# Patient Record
Sex: Female | Born: 1950
Health system: Southern US, Community
[De-identification: ages and names within clinical notes are randomized; demographics above are authoritative.]

## PROBLEM LIST (undated history)

## (undated) DIAGNOSIS — N979 Female infertility, unspecified: Secondary | ICD-10-CM

## (undated) DIAGNOSIS — E782 Mixed hyperlipidemia: Secondary | ICD-10-CM

## (undated) DIAGNOSIS — G43909 Migraine, unspecified, not intractable, without status migrainosus: Secondary | ICD-10-CM

## (undated) DIAGNOSIS — N951 Menopausal and female climacteric states: Secondary | ICD-10-CM

## (undated) DIAGNOSIS — R200 Anesthesia of skin: Secondary | ICD-10-CM

## (undated) DIAGNOSIS — I10 Essential (primary) hypertension: Secondary | ICD-10-CM

## (undated) HISTORY — DX: Mixed hyperlipidemia: E78.2

## (undated) HISTORY — PX: APPENDECTOMY: SHX54

## (undated) HISTORY — DX: Migraine, unspecified, not intractable, without status migrainosus: G43.909

## (undated) HISTORY — PX: BREAST BIOPSY: SHX20

## (undated) HISTORY — DX: Menopausal and female climacteric states: N95.1

## (undated) HISTORY — DX: Anesthesia of skin: R20.0

## (undated) HISTORY — DX: Essential (primary) hypertension: I10

## (undated) HISTORY — DX: Female infertility, unspecified: N97.9

## (undated) HISTORY — PX: TONSILLECTOMY AND ADENOIDECTOMY: SHX28

---

## 1996-11-18 HISTORY — PX: ABDOMINAL HYSTERECTOMY: SHX81

## 2010-08-31 ENCOUNTER — Encounter: Admission: RE | Admit: 2010-08-31 | Discharge: 2010-08-31 | Payer: Self-pay | Admitting: Obstetrics and Gynecology

## 2010-10-31 ENCOUNTER — Encounter
Admission: RE | Admit: 2010-10-31 | Discharge: 2010-10-31 | Payer: Self-pay | Source: Home / Self Care | Attending: Obstetrics and Gynecology | Admitting: Obstetrics and Gynecology

## 2011-09-02 ENCOUNTER — Other Ambulatory Visit: Payer: Self-pay | Admitting: Certified Nurse Midwife

## 2011-09-02 DIAGNOSIS — N6320 Unspecified lump in the left breast, unspecified quadrant: Secondary | ICD-10-CM

## 2011-09-04 ENCOUNTER — Ambulatory Visit
Admission: RE | Admit: 2011-09-04 | Discharge: 2011-09-04 | Disposition: A | Discharge status: Home / Self Care | Payer: BC Managed Care – PPO | Source: Ambulatory Visit | Attending: Certified Nurse Midwife | Admitting: Certified Nurse Midwife

## 2011-09-04 DIAGNOSIS — N6320 Unspecified lump in the left breast, unspecified quadrant: Secondary | ICD-10-CM

## 2011-10-21 ENCOUNTER — Other Ambulatory Visit: Payer: Self-pay | Admitting: Obstetrics and Gynecology

## 2011-10-21 DIAGNOSIS — Z1231 Encounter for screening mammogram for malignant neoplasm of breast: Secondary | ICD-10-CM

## 2011-11-13 ENCOUNTER — Ambulatory Visit
Admission: RE | Admit: 2011-11-13 | Discharge: 2011-11-13 | Disposition: A | Discharge status: Home / Self Care | Payer: BC Managed Care – PPO | Source: Ambulatory Visit | Attending: Obstetrics and Gynecology | Admitting: Obstetrics and Gynecology

## 2011-11-13 DIAGNOSIS — Z1231 Encounter for screening mammogram for malignant neoplasm of breast: Secondary | ICD-10-CM

## 2012-10-12 ENCOUNTER — Other Ambulatory Visit: Payer: Self-pay | Admitting: Obstetrics and Gynecology

## 2012-10-12 DIAGNOSIS — Z1231 Encounter for screening mammogram for malignant neoplasm of breast: Secondary | ICD-10-CM

## 2012-11-25 ENCOUNTER — Ambulatory Visit
Admission: RE | Admit: 2012-11-25 | Discharge: 2012-11-25 | Disposition: A | Discharge status: Home / Self Care | Payer: BC Managed Care – PPO | Source: Ambulatory Visit | Attending: Obstetrics and Gynecology | Admitting: Obstetrics and Gynecology

## 2012-11-25 DIAGNOSIS — Z1231 Encounter for screening mammogram for malignant neoplasm of breast: Secondary | ICD-10-CM

## 2012-11-25 LAB — HM MAMMOGRAPHY

## 2013-09-08 ENCOUNTER — Ambulatory Visit: Payer: Self-pay | Admitting: Obstetrics and Gynecology

## 2013-09-15 ENCOUNTER — Ambulatory Visit: Payer: Self-pay | Admitting: Obstetrics and Gynecology

## 2013-09-16 ENCOUNTER — Encounter: Payer: Self-pay | Admitting: Obstetrics and Gynecology

## 2013-09-16 ENCOUNTER — Ambulatory Visit (INDEPENDENT_AMBULATORY_CARE_PROVIDER_SITE_OTHER): Payer: BC Managed Care – PPO | Admitting: Obstetrics and Gynecology

## 2013-09-16 VITALS — BP 110/66 | HR 72 | Resp 16 | Ht 67.25 in | Wt 152.0 lb

## 2013-09-16 DIAGNOSIS — Z01419 Encounter for gynecological examination (general) (routine) without abnormal findings: Secondary | ICD-10-CM

## 2013-09-16 MED ORDER — ESTRADIOL 0.5 MG PO TABS: 0.5000 mg | ORAL_TABLET | Freq: Every day | ORAL | Status: DC

## 2013-09-16 NOTE — Progress Notes (Signed)
GYNECOLOGY VISIT  PCP: Dr. Maurice Small  Referring provider:   HPI: 62 y.o.   Married  Caucasian  female   G0P0 with Patient's last menstrual period was 11/18/1996.   here for   Annual Exam. Estrace 0.5 mg every other day.   No hot flashes. Wants to continue on estrogen.   Poor sleep quality.  Does not stay asleep well. Functions well during the day.   Hgb: PCP  Urine:  PCP  GYNECOLOGIC HISTORY: Patient's last menstrual period was 11/18/1996. Sexually active:  no Partner preference: female Contraception:   TAH/BSO/appy 1998 Menopausal hormone therapy: yes Estradiol DES exposure:   no Blood transfusions:   no Sexually transmitted diseases:   no GYN Procedures:  TAH, Laparoscopy Mammogram:    11/25/12 neg             Pap:   2009 neg History of abnormal pap smear:  no   OB History   Grav Para Term Preterm Abortions TAB SAB Ect Mult Living   0                LIFESTYLE: Exercise:  Walking 5-7 days a week          Tobacco: no Alcohol: occ glass of wine Drug use:  no  OTHER HEALTH MAINTENANCE: Tetanus/TDap: 2012 Gardisil: no Influenza: no  Zostavax: no  Bone density: several years ago Colonoscopy: declined  Cholesterol check: 2013 normal  Family History  Problem Relation Age of Onset  . Lupus Mother   . Osteoporosis Mother   . Diabetes Father   . Hypertension Father   . Breast cancer Sister 14  . Hypertension Brother     There are no active problems to display for this patient.  Past Medical History  Diagnosis Date  . Hypertension   . Migraines   . Infertility, female     Past Surgical History  Procedure Laterality Date  . Appendectomy    . Tonsillectomy and adenoidectomy    . Abdominal hysterectomy  1998    TAH/BSO--d/t fibroids    ALLERGIES: Review of patient's allergies indicates no known allergies.  Current Outpatient Prescriptions  Medication Sig Dispense Refill  . Calcium Carbonate-Vit D-Min (CALCIUM 1200 PO) Take by mouth daily.       Marland Kitchen estradiol (ESTRACE) 0.5 MG tablet 0.5 mg daily.       Marland Kitchen lisinopril (PRINIVIL,ZESTRIL) 10 MG tablet Take 10 mg by mouth daily.      . fish oil-omega-3 fatty acids 1000 MG capsule Take 2 g by mouth daily.      . niacin 500 MG tablet Take 500 mg by mouth daily with breakfast.       No current facility-administered medications for this visit.     ROS:  Pertinent items are noted in HPI.  SOCIAL HISTORY:   Teacher, English as a foreign language, ballroom dancing.   PHYSICAL EXAMINATION:    BP 110/66  Pulse 72  Resp 16  Ht 5' 7.25" (1.708 m)  Wt 152 lb (68.947 kg)  BMI 23.63 kg/m2  LMP 11/18/1996   Wt Readings from Last 3 Encounters:  09/16/13 152 lb (68.947 kg)     Ht Readings from Last 3 Encounters:  09/16/13 5' 7.25" (1.708 m)    General appearance: alert, cooperative and appears stated age Head: Normocephalic, without obvious abnormality, atraumatic Neck: no adenopathy, supple, symmetrical, trachea midline and thyroid not enlarged, symmetric, no tenderness/mass/nodules Lungs: clear to auscultation bilaterally Breasts: Inspection negative, No nipple retraction or dimpling, No nipple discharge or bleeding,  No axillary or supraclavicular adenopathy, Normal to palpation without dominant masses Heart: regular rate and rhythm Abdomen: soft, non-tender; no masses,  no organomegaly Extremities: extremities normal, atraumatic, no cyanosis or edema Skin: Skin color, texture, turgor normal. No rashes or lesions Lymph nodes: Cervical, supraclavicular, and axillary nodes normal. No abnormal inguinal nodes palpated Neurologic: Grossly normal  Pelvic: External genitalia:  no lesions              Urethra:  normal appearing urethra with no masses, tenderness or lesions              Bartholins and Skenes: normal                 Vagina: normal appearing vagina with normal color and discharge, no lesions              Cervix: absent.              Pap and high risk HPV testing done: no.            Bimanual Exam:  Uterus:    absent                                      Adnexa: normal adnexa in size, nontender and no masses                                      Rectovaginal: Confirms                                      Anus:  normal sphincter tone, no lesions  ASSESSMENT  Normal gynecologic exam. Estrogen therapy patient.  Desires to continue.   PLAN  Mammogram Pap smear and high risk HPV testing not indicated Estrace 0.5 mg daily.  OK if patient chooses to take every other day. #90.  RF - 3.  I discussed risks and benefits with patient, and she wishes to continue.  Medications per Epic orders. Stool hemoccult kit to patient. Return annually or prn   An After Visit Summary was printed and given to the patient.

## 2013-09-16 NOTE — Patient Instructions (Signed)

## 2013-10-20 ENCOUNTER — Other Ambulatory Visit: Payer: Self-pay

## 2013-10-20 ENCOUNTER — Telehealth: Payer: Self-pay | Admitting: *Deleted

## 2013-10-20 DIAGNOSIS — Z1231 Encounter for screening mammogram for malignant neoplasm of breast: Secondary | ICD-10-CM

## 2013-10-20 NOTE — Telephone Encounter (Signed)
Message copied by Lorraine Lax on Wed Oct 20, 2013 10:52 AM ------      Message from: Conley Simmonds      Created: Wed Oct 20, 2013 10:37 AM      Regarding: Please contact patient to remind her of doing IFOB.       Please contact patient as above.            Thanks.             ----- Message -----         From: SYSTEM         Sent: 09/21/2013  12:09 AM           To: Melony Overly, MD                   ------

## 2013-10-20 NOTE — Telephone Encounter (Signed)
Returning call.

## 2013-10-20 NOTE — Telephone Encounter (Signed)
Patient notified and reminded to send her IFOB in as soon as she can. Asked about her Estardiol rx which was sent 09/16/13 #90/3 refills says she hasn't received anything. Called [facility] patient was sent a shipment on 08/17/13 her new rx doesn't go into affect until 09/07/13. Notified patient of this she said she does have plenty of Estradiol just wanted to see what was going on.

## 2013-10-20 NOTE — Telephone Encounter (Signed)
Left Message To Call Back  

## 2013-10-20 NOTE — Telephone Encounter (Signed)
Typo  Shipment sent in 11/07/13**

## 2013-10-22 NOTE — Progress Notes (Signed)
Patient notified of importance of doing IFOB 10/20/13

## 2013-11-26 ENCOUNTER — Ambulatory Visit
Admission: RE | Admit: 2013-11-26 | Discharge: 2013-11-26 | Disposition: A | Discharge status: Home / Self Care | Payer: BC Managed Care – PPO | Source: Ambulatory Visit

## 2013-11-26 DIAGNOSIS — Z1231 Encounter for screening mammogram for malignant neoplasm of breast: Secondary | ICD-10-CM

## 2013-12-03 ENCOUNTER — Encounter: Payer: Self-pay | Admitting: Emergency Medicine

## 2014-06-18 ENCOUNTER — Encounter: Payer: Self-pay | Admitting: *Deleted

## 2014-07-22 ENCOUNTER — Encounter: Payer: Self-pay | Admitting: Obstetrics and Gynecology

## 2014-09-21 ENCOUNTER — Ambulatory Visit: Payer: BC Managed Care – PPO | Admitting: Obstetrics and Gynecology

## 2014-09-22 ENCOUNTER — Ambulatory Visit: Payer: BC Managed Care – PPO | Admitting: Obstetrics and Gynecology

## 2014-10-24 ENCOUNTER — Other Ambulatory Visit: Payer: Self-pay

## 2014-10-24 DIAGNOSIS — Z1231 Encounter for screening mammogram for malignant neoplasm of breast: Secondary | ICD-10-CM

## 2014-11-04 ENCOUNTER — Other Ambulatory Visit: Payer: Self-pay | Admitting: Obstetrics and Gynecology

## 2014-11-07 NOTE — Telephone Encounter (Signed)
Medication refill request: Estrace 0.5mg  Last AEX:  09/16/13 Next AEX: 02/01/15 Last MMG (if hormonal medication request): 11/26/13 Bi-Rads Neg Refill authorized: #90 X 2

## 2014-11-30 ENCOUNTER — Ambulatory Visit
Admission: RE | Admit: 2014-11-30 | Discharge: 2014-11-30 | Disposition: A | Discharge status: Home / Self Care | Payer: BLUE CROSS/BLUE SHIELD | Source: Ambulatory Visit

## 2014-11-30 DIAGNOSIS — Z1231 Encounter for screening mammogram for malignant neoplasm of breast: Secondary | ICD-10-CM

## 2015-02-01 ENCOUNTER — Ambulatory Visit: Payer: BLUE CROSS/BLUE SHIELD | Admitting: Cardiovascular Disease

## 2015-02-01 ENCOUNTER — Ambulatory Visit: Payer: BC Managed Care – PPO | Admitting: Obstetrics and Gynecology

## 2015-10-02 ENCOUNTER — Other Ambulatory Visit: Payer: Self-pay | Admitting: Obstetrics and Gynecology

## 2015-11-09 ENCOUNTER — Other Ambulatory Visit: Payer: Self-pay

## 2015-11-09 DIAGNOSIS — Z1231 Encounter for screening mammogram for malignant neoplasm of breast: Secondary | ICD-10-CM

## 2015-12-07 ENCOUNTER — Ambulatory Visit
Admission: RE | Admit: 2015-12-07 | Discharge: 2015-12-07 | Disposition: A | Discharge status: Home / Self Care | Payer: BLUE CROSS/BLUE SHIELD | Source: Ambulatory Visit

## 2015-12-07 DIAGNOSIS — Z1231 Encounter for screening mammogram for malignant neoplasm of breast: Secondary | ICD-10-CM

## 2016-03-28 DIAGNOSIS — H43811 Vitreous degeneration, right eye: Secondary | ICD-10-CM | POA: Diagnosis not present

## 2016-06-03 DIAGNOSIS — M170 Bilateral primary osteoarthritis of knee: Secondary | ICD-10-CM | POA: Diagnosis not present

## 2016-11-01 ENCOUNTER — Other Ambulatory Visit: Payer: Self-pay | Admitting: Family Medicine

## 2016-11-01 DIAGNOSIS — Z1231 Encounter for screening mammogram for malignant neoplasm of breast: Secondary | ICD-10-CM

## 2016-12-09 ENCOUNTER — Ambulatory Visit
Admission: RE | Admit: 2016-12-09 | Discharge: 2016-12-09 | Disposition: A | Discharge status: Home / Self Care | Payer: BLUE CROSS/BLUE SHIELD | Source: Ambulatory Visit | Attending: Family Medicine | Admitting: Family Medicine

## 2016-12-09 DIAGNOSIS — Z1231 Encounter for screening mammogram for malignant neoplasm of breast: Secondary | ICD-10-CM

## 2016-12-17 DIAGNOSIS — M79602 Pain in left arm: Secondary | ICD-10-CM | POA: Diagnosis not present

## 2016-12-17 DIAGNOSIS — Z23 Encounter for immunization: Secondary | ICD-10-CM | POA: Diagnosis not present

## 2017-01-24 ENCOUNTER — Emergency Department (HOSPITAL_COMMUNITY)
Admission: EM | Admit: 2017-01-24 | Discharge: 2017-01-24 | Disposition: A | Discharge status: Home / Self Care | Payer: BLUE CROSS/BLUE SHIELD | Attending: Emergency Medicine | Admitting: Emergency Medicine

## 2017-01-24 ENCOUNTER — Emergency Department (HOSPITAL_COMMUNITY): Payer: BLUE CROSS/BLUE SHIELD

## 2017-01-24 ENCOUNTER — Encounter (HOSPITAL_COMMUNITY): Payer: Self-pay | Admitting: *Deleted

## 2017-01-24 DIAGNOSIS — R0989 Other specified symptoms and signs involving the circulatory and respiratory systems: Secondary | ICD-10-CM | POA: Diagnosis not present

## 2017-01-24 DIAGNOSIS — S1095XA Superficial foreign body of unspecified part of neck, initial encounter: Secondary | ICD-10-CM | POA: Diagnosis not present

## 2017-01-24 DIAGNOSIS — I10 Essential (primary) hypertension: Secondary | ICD-10-CM | POA: Diagnosis not present

## 2017-01-24 NOTE — ED Provider Notes (Signed)
[facility]-EMERGENCY DEPT Provider Note   CSN: [id number] Arrival date & time: 01/24/17  1856     History   Chief Complaint Chief Complaint  Patient presents with  . Swallowed Foreign Body    HPI Janice IdeDeborah Faulkner is a 66 y.o. female presents with foreign body sensation in the back of her throat described as intermittent, sharp pain after eating fish prior to arrival. Patient states that she finished her meal without any issues, few minutes later she felt a hot sensation in the back of her throat. She tried brushing her teeth, coughing and flossing her teeth but the sensation persisted. She was concerned she may have a fishbone stuck in the back of her throat and came to the ED for evaluation. Patient denies difficulty breathing, difficulty swallowing, difficulty controlling oral secretions. Patient has been tolerating water by mouth without issues. No changes in her voice. No cough.  HPI  Past Medical History:  Diagnosis Date  . Hypertension   . Infertility, female   . Menopausal symptoms   . Migraines   . Mixed hyperlipidemia     There are no active problems to display for this patient.   Past Surgical History:  Procedure Laterality Date  . ABDOMINAL HYSTERECTOMY  1998   TAH/BSO--d/t fibroids  . APPENDECTOMY    . TONSILLECTOMY AND ADENOIDECTOMY      OB History    Gravida Para Term Preterm AB Living   0             SAB TAB Ectopic Multiple Live Births                   Home Medications    Prior to Admission medications   Medication Sig Start Date End Date Taking? Authorizing Provider  Calcium Carbonate-Vit D-Min (CALCIUM 1200 PO) Take 1 tablet by mouth daily.    Yes Historical Provider, MD  lisinopril (PRINIVIL,ZESTRIL) 10 MG tablet Take 10 mg by mouth daily.   Yes Historical Provider, MD  estradiol (ESTRACE) 0.5 MG tablet TAKE 1 TABLET DAILY Patient not taking: Reported on 01/24/2017 11/07/14   Patton SallesBrook E Amundson C Silva, MD    Family History Family History  Problem  Relation Age of Onset  . Lupus Mother   . Osteoporosis Mother   . Diabetes Father   . Hypertension Father   . Breast cancer Sister [id number]  . Hypertension Brother     Social History Social History  Substance Use Topics  . Smoking status: Never Smoker  . Smokeless tobacco: Never Used  . Alcohol use No     Allergies   Patient has no known allergies.   Review of Systems Review of Systems  HENT: Negative for drooling, sore throat, trouble swallowing and voice change.   Respiratory: Negative for cough, choking, chest tightness and shortness of breath.   Cardiovascular: Negative for chest pain.  Gastrointestinal: Negative for nausea and vomiting.  Musculoskeletal: Negative for neck pain.  Neurological: Negative for speech difficulty.  Hematological: Does not bruise/bleed easily.  Psychiatric/Behavioral: Negative.      Physical Exam Updated Vital Signs BP (!) 125/54   Pulse 72   Temp 97.6 F (36.4 C) (Oral)   Resp 16   Ht 5' 7.5" (1.715 m)   Wt 66 kg   LMP 11/18/1996   SpO2 98%   BMI 22.45 kg/m   Physical Exam  Constitutional: She is oriented to person, place, and time. She appears well-developed and well-nourished.  HENT:  Head: Normocephalic and atraumatic.  Nose: Nose normal.  Mouth/Throat: Oropharynx is clear and moist. No oropharyngeal exudate.  Moist mucous membranes. Uvula is midline. No tonsillar edema, erythema or inflammation. No pooling of oral secretions. Oral airway is widely patent. No foreign bodies visualized in posterior oropharynx.  Eyes: Conjunctivae and EOM are normal. Pupils are equal, round, and reactive to light.  Neck: Normal range of motion. Neck supple. No JVD present.  No anterior neck tenderness. No cervical adenopathy.  Cardiovascular: Normal rate, regular rhythm, normal heart sounds and intact distal pulses.   No murmur heard. Pulmonary/Chest: Effort normal and breath sounds normal. No respiratory distress. She has no wheezes. She has no  rales. She exhibits no tenderness.  Abdominal: Soft. There is no tenderness.  Musculoskeletal: Normal range of motion. She exhibits no deformity.  Lymphadenopathy:    She has no cervical adenopathy.  Neurological: She is alert and oriented to person, place, and time. No sensory deficit.  Skin: Skin is warm and dry. Capillary refill takes less than 2 seconds.  Psychiatric: She has a normal mood and affect. Her behavior is normal. Judgment and thought content normal.  Nursing note and vitals reviewed.    ED Treatments / Results  Labs (all labs ordered are listed, but only abnormal results are displayed) Labs Reviewed - No data to display  EKG  EKG Interpretation None       Radiology Ct Soft Tissue Neck Wo Contrast  Result Date: 01/24/2017 CLINICAL DATA:  Fish bone lodged in back of throat today. Assess for foreign body. EXAM: CT NECK WITHOUT CONTRAST TECHNIQUE: Multidetector CT imaging of the neck was performed following the standard protocol without intravenous contrast. COMPARISON:  None. FINDINGS: PHARYNX AND LARYNX: 6 mm focus of subcutaneous gas at nasopharynx. Normal. Widely patent airway. No radiopaque foreign bodies. SALIVARY GLANDS: Normal. THYROID: Normal. LYMPH NODES: No lymphadenopathy by CT size criteria though sensitivity decreased without contrast. VASCULAR: Trace calcific atherosclerosis RIGHT carotid bifurcation. LIMITED INTRACRANIAL: Normal. VISUALIZED ORBITS: Normal. MASTOIDS AND VISUALIZED PARANASAL SINUSES: LEFT anterior frontal osteoma. Small LEFT maxillary mucosal retention cyst. Mastoid air cells are well aerated. SKELETON: Nonacute. Severe facet arthropathy. RIGHT maxillary molar periapical lucency/abscess. UPPER CHEST: Lung apices are clear. No superior mediastinal lymphadenopathy. OTHER: None. IMPRESSION: No radiopaque foreign bodies. 6 mm focus of subcutaneous gas nasopharynx suggests penetrating trauma. Electronically Signed   By: Awilda Metro M.D.   On:  01/24/2017 19:59    Procedures Procedures (including critical care time)  Medications Ordered in ED Medications - No data to display   Initial Impression / Assessment and Plan / ED Course  I have reviewed the triage vital signs and the nursing notes.  Pertinent labs & imaging results that were available during my care of the patient were reviewed by me and considered in my medical decision making (see chart for details).  Clinical Course as of Jan 24 2253  Fri Jan 24, 2017  2159 IMPRESSION: No radiopaque foreign bodies. 6 mm focus of subcutaneous gas nasopharynx suggests penetrating trauma. CT Soft Tissue Neck Wo Contrast [CG]    Clinical Course User Index [CG] Liberty Handy, PA-C   66 year old female with no pertinent past medical history presents to ED with foreign body sensation in the back of her throat after eating fish. Patient reporting intermittent, sharp left-sided throat pain. Exam is reassuring. Patient is not having compromise of oral airway, no voice changes, no anterior neck tenderness. CT scan shows small area of subcutaneous gas nasopharynx suggesting penetrating trauma however no radiopaque foreign  bodies were visualized. Given reassuring exam patient will be discharged at this time with strict ED return precautions, ENT follow-up. Patient tolerating fluids by mouth without complications. No further emergent lab work or imaging indicated at this time. Patient discussed with Dr. Fredderick Phenix who agrees with ED workup and discharge plan. Discussed plan with patient and her husband who are agreeable to discharge. Patient aware of red flag symptoms that would warrant return to the emergency department for further workup.  Final Clinical Impressions(s) / ED Diagnoses   Final diagnoses:  Foreign body sensation in throat    New Prescriptions New Prescriptions   No medications on file     Liberty Handy, PA-C 01/24/17 2254    Rolan Bucco, MD 01/24/17 2359

## 2017-01-24 NOTE — ED Triage Notes (Signed)
The pt was eating perch aprox one hour ago small bone lodged in her lt throat she has not been able to remove it

## 2017-01-24 NOTE — ED Notes (Signed)
Pt states she was eating fish and believes she swallowed some bones from the fish.

## 2017-01-24 NOTE — Discharge Instructions (Signed)
CT scan findings: IMPRESSION:  No radiopaque foreign bodies. 6 mm focus of subcutaneous gas  nasopharynx suggests penetrating trauma.   FINDINGS:  PHARYNX AND LARYNX: 6 mm focus of subcutaneous gas at nasopharynx.  Normal. Widely patent airway. No radiopaque foreign bodies.   As we discussed there is a small area of subcutaneous gast at your nasopharynx suggesting recent penetrating trauma somewhere along your upper GI tract.  At this time there is no further emergent intervention recommended as you are stable, are not having difficulty breathing, have no voice changes or have any other concerning symptoms.  Please monitor your symptoms. Return to the emergency department if your symptoms worsen, you develop difficulty swallowing your oral secretions, changes in your voice, difficulty breathing or if your symptoms become concerning in any way.   We recommend a soft diet for the next couple of days to prevent further irritation.  Please call ENT to make an appointment for follow up, as needed based on your symptoms.

## 2017-02-26 ENCOUNTER — Other Ambulatory Visit: Payer: Self-pay | Admitting: Family Medicine

## 2017-02-26 ENCOUNTER — Ambulatory Visit
Admission: RE | Admit: 2017-02-26 | Discharge: 2017-02-26 | Disposition: A | Discharge status: Home / Self Care | Payer: BLUE CROSS/BLUE SHIELD | Source: Ambulatory Visit | Attending: Family Medicine | Admitting: Family Medicine

## 2017-02-26 DIAGNOSIS — M7989 Other specified soft tissue disorders: Secondary | ICD-10-CM | POA: Diagnosis not present

## 2017-02-26 DIAGNOSIS — R52 Pain, unspecified: Secondary | ICD-10-CM

## 2017-02-26 DIAGNOSIS — M25539 Pain in unspecified wrist: Secondary | ICD-10-CM | POA: Diagnosis not present

## 2017-02-26 DIAGNOSIS — R609 Edema, unspecified: Secondary | ICD-10-CM

## 2017-03-10 DIAGNOSIS — M25512 Pain in left shoulder: Secondary | ICD-10-CM | POA: Diagnosis not present

## 2017-03-10 DIAGNOSIS — M25532 Pain in left wrist: Secondary | ICD-10-CM | POA: Diagnosis not present

## 2017-03-11 DIAGNOSIS — M542 Cervicalgia: Secondary | ICD-10-CM | POA: Diagnosis not present

## 2017-03-11 DIAGNOSIS — G5602 Carpal tunnel syndrome, left upper limb: Secondary | ICD-10-CM | POA: Diagnosis not present

## 2017-03-11 DIAGNOSIS — M25511 Pain in right shoulder: Secondary | ICD-10-CM | POA: Diagnosis not present

## 2017-03-11 DIAGNOSIS — M25512 Pain in left shoulder: Secondary | ICD-10-CM | POA: Diagnosis not present

## 2017-03-18 DIAGNOSIS — M542 Cervicalgia: Secondary | ICD-10-CM | POA: Diagnosis not present

## 2017-03-18 DIAGNOSIS — M25512 Pain in left shoulder: Secondary | ICD-10-CM | POA: Diagnosis not present

## 2017-03-18 DIAGNOSIS — M25532 Pain in left wrist: Secondary | ICD-10-CM | POA: Diagnosis not present

## 2017-03-25 DIAGNOSIS — M5412 Radiculopathy, cervical region: Secondary | ICD-10-CM | POA: Diagnosis not present

## 2017-04-02 DIAGNOSIS — M542 Cervicalgia: Secondary | ICD-10-CM | POA: Diagnosis not present

## 2017-04-02 DIAGNOSIS — M5412 Radiculopathy, cervical region: Secondary | ICD-10-CM | POA: Diagnosis not present

## 2017-04-10 DIAGNOSIS — M5412 Radiculopathy, cervical region: Secondary | ICD-10-CM | POA: Diagnosis not present

## 2017-04-29 DIAGNOSIS — Z23 Encounter for immunization: Secondary | ICD-10-CM | POA: Diagnosis not present

## 2017-04-29 DIAGNOSIS — E782 Mixed hyperlipidemia: Secondary | ICD-10-CM | POA: Diagnosis not present

## 2017-04-29 DIAGNOSIS — G629 Polyneuropathy, unspecified: Secondary | ICD-10-CM | POA: Diagnosis not present

## 2017-04-29 DIAGNOSIS — I10 Essential (primary) hypertension: Secondary | ICD-10-CM | POA: Diagnosis not present

## 2017-04-29 DIAGNOSIS — Z0000 Encounter for general adult medical examination without abnormal findings: Secondary | ICD-10-CM | POA: Diagnosis not present

## 2017-05-05 ENCOUNTER — Ambulatory Visit (INDEPENDENT_AMBULATORY_CARE_PROVIDER_SITE_OTHER): Payer: BLUE CROSS/BLUE SHIELD | Admitting: Neurology

## 2017-05-05 ENCOUNTER — Telehealth: Payer: Self-pay | Admitting: *Deleted

## 2017-05-05 ENCOUNTER — Encounter: Payer: Self-pay | Admitting: Neurology

## 2017-05-05 VITALS — BP 118/59 | HR 77 | Ht 68.0 in | Wt 148.5 lb

## 2017-05-05 DIAGNOSIS — M25561 Pain in right knee: Principal | ICD-10-CM

## 2017-05-05 DIAGNOSIS — G8929 Other chronic pain: Secondary | ICD-10-CM

## 2017-05-05 DIAGNOSIS — M25562 Pain in left knee: Principal | ICD-10-CM

## 2017-05-05 DIAGNOSIS — M25532 Pain in left wrist: Secondary | ICD-10-CM | POA: Insufficient documentation

## 2017-05-05 DIAGNOSIS — M25552 Pain in left hip: Principal | ICD-10-CM

## 2017-05-05 DIAGNOSIS — M25551 Pain in right hip: Principal | ICD-10-CM

## 2017-05-05 MED ORDER — PREDNISONE 10 MG PO TABS: 10.0000 mg | ORAL_TABLET | Freq: Two times a day (BID) | ORAL | 3 refills | Status: DC

## 2017-05-05 NOTE — Patient Instructions (Signed)
  We will get the blood work done from your physician, consider further evaluation.

## 2017-05-05 NOTE — Progress Notes (Signed)
Reason for visit: Left wrist pain  Referring physician: Dr. Warnell Bureau Faulkner is a 66 y.o. female  History of present illness:  Ms. Janice Faulkner is a 66 year old right-handed white female with a history of onset of symptoms that began on 02/12/2017. The patient woke up at 3 AM with severe left wrist and hand discomfort with associated warmth and swelling at the wrist. The patient had some tingly sensations into the left hand. She was seen by Dr. Mina Faulkner and underwent EMG and nerve conduction study evaluation that suggested a mild left carpal tunnel syndrome. The patient was treated with nonsteroidal anti-inflammatory medications initially without benefit, but eventually she was placed on prednisone which improved symptoms within hours of taking the medication. The patient also has had some discomfort in the left triceps area and some discomfort into the shoulders bilaterally and the hips bilaterally. She reports no problems with weakness or balance changes. The patient has not had any neck pain or low back pain and no problems controlling the bowels or the bladder. The patient feels well when she is on the prednisone, but when she comes off the medication the symptoms return. She has had blood work done through her primary care physician, results of this are not available to me. The patient has undergone MRI evaluation of the cervical spine that was brought for my review. This appears to show minimal disc bulges at the C3-4 and C6-7 levels, no evidence of nerve root impingement was seen.    Past Medical History:  Diagnosis Date  . Hypertension   . Infertility, female   . Menopausal symptoms   . Migraines   . Mixed hyperlipidemia     Past Surgical History:  Procedure Laterality Date  . ABDOMINAL HYSTERECTOMY  1998   TAH/BSO--d/t fibroids  . APPENDECTOMY    . TONSILLECTOMY AND ADENOIDECTOMY      Family History  Problem Relation Age of Onset  . Lupus Mother   . Osteoporosis Mother     . Diabetes Father   . Hypertension Father   . Breast cancer Sister 43  . Hypertension Brother     Social history:  reports that she has never smoked. She has never used smokeless tobacco. She reports that she does not drink alcohol or use drugs.  Medications:  Prior to Admission medications   Medication Sig Start Date End Date Taking? Authorizing Provider  Calcium Carbonate-Vit D-Min (CALCIUM 1200 PO) Take 1 tablet by mouth daily.    Yes [provider]  lisinopril (PRINIVIL,ZESTRIL) 10 MG tablet Take 10 mg by mouth daily.   Yes [provider]  methylPREDNISolone (MEDROL DOSEPAK) 4 MG TBPK tablet follow package directions 03/18/17  Yes [provider]     No Known Allergies  ROS:  Out of a complete 14 system review of symptoms, the patient complains only of the following symptoms, and all other reviewed systems are negative.  Joint pain, joint swelling, aching muscles numbness  Blood pressure (!) 118/59, pulse 77, height 5\' 8"  (1.727 m), weight 148 lb 8 oz (67.4 kg), last menstrual period 11/18/1996.  Physical Exam  General: The patient is alert and cooperative at the time of the examination.  Eyes: Pupils are equal, round, and reactive to light. Discs are flat bilaterally.  Neck: The neck is supple, no carotid bruits are noted.  Respiratory: The respiratory examination is clear.  Cardiovascular: The cardiovascular examination reveals a regular rate and rhythm, no obvious murmurs or rubs are noted.  Skin:  Extremities are without significant edema.  Neurologic Exam  Mental status: The patient is alert and oriented x 3 at the time of the examination. The patient has apparent normal recent and remote memory, with an apparently normal attention span and concentration ability.  Cranial nerves: Facial symmetry is present. There is good sensation of the face to pinprick and soft touch bilaterally. The strength of the facial muscles and the muscles to  head turning and shoulder shrug are normal bilaterally. Speech is well enunciated, no aphasia or dysarthria is noted. Extraocular movements are full. Visual fields are full. The tongue is midline, and the patient has symmetric elevation of the soft palate. No obvious hearing deficits are noted.  Motor: The motor testing reveals 5 over 5 strength of all 4 extremities. Good symmetric motor tone is noted throughout.  Sensory: Sensory testing is intact to pinprick, soft touch, vibration sensation, and position sense on all 4 extremities. No evidence of extinction is noted.  Coordination: Cerebellar testing reveals good finger-nose-finger and heel-to-shin bilaterally.  Gait and station: Gait is normal. Tandem gait is normal. Romberg is negative. No drift is seen.  Reflexes: Deep tendon reflexes are symmetric and normal bilaterally. Toes are downgoing bilaterally.   Assessment/Plan:   1. Left wrist pain  The patient appears to have developed an inflammatory arthritis or tendinitis issue. The patient has pain and swelling in the left wrist that developed rapidly, the symptoms are quite responsive to prednisone. The patient has had blood work done through the primary care physician, the results are not available to me. I will need to review the blood work, and determine whether or not further blood work as needed. The patient will be given a referral to a rheumatology physician. I will contact the patient within the next day or so.  Marlan Faulkner. Janice Willis MD 05/05/2017 8:42 AM  [facility] [address] [place], [place] [id number]  Phone [phone] Fax [phone]

## 2017-05-05 NOTE — Telephone Encounter (Signed)
Called and spoke with Clydie BraunKaren from PCP Maurice Small([facility]) office. Requested recent blood work results faxed to our office at [phone]. She will fax over to our office as requested.

## 2017-05-05 NOTE — Telephone Encounter (Signed)
Called PCP office back. Advised we have not received lab results. Spoke with Okey Regalarol. Clydie BraunKaren not available to speak with. She will try and re-fax. Verified fax number.

## 2017-05-06 NOTE — Telephone Encounter (Signed)
I called patient. The patient did have blood work that included a comprehensive metabolic profile that was unremarkable, sedimentation rate was 14, but the patient was on prednisone at the time. We will check further blood work looking for rheumatoid arthritis or lupus. The patient has been referred to a rheumatologist.

## 2017-05-06 NOTE — Telephone Encounter (Signed)
Dr Anne HahnWillis- received results via fax. Gave to you for review, thank you

## 2017-05-06 NOTE — Telephone Encounter (Signed)
Called and spoke with Clydie BraunKaren. Advised we are having fax issues and asked her to try and re-fax lab results from 04/29/17 to [phone]. She will re-send.

## 2017-05-06 NOTE — Addendum Note (Signed)
Addended by: York SpanielWILLIS, Tiyanna Larcom K on: 05/06/2017 04:46 PM   Modules accepted: Orders

## 2017-05-08 ENCOUNTER — Other Ambulatory Visit (INDEPENDENT_AMBULATORY_CARE_PROVIDER_SITE_OTHER): Payer: Self-pay

## 2017-05-08 DIAGNOSIS — M25551 Pain in right hip: Secondary | ICD-10-CM | POA: Diagnosis not present

## 2017-05-08 DIAGNOSIS — M25561 Pain in right knee: Secondary | ICD-10-CM | POA: Diagnosis not present

## 2017-05-08 DIAGNOSIS — M25562 Pain in left knee: Principal | ICD-10-CM

## 2017-05-08 DIAGNOSIS — G8929 Other chronic pain: Secondary | ICD-10-CM

## 2017-05-08 DIAGNOSIS — M25552 Pain in left hip: Secondary | ICD-10-CM | POA: Diagnosis not present

## 2017-05-08 DIAGNOSIS — Z0289 Encounter for other administrative examinations: Secondary | ICD-10-CM

## 2017-05-10 LAB — PAN-ANCA
ANCA Proteinase 3: <3.5 U/mL (ref 0.0–3.5)
Atypical pANCA: <1:20 {titer}
C-ANCA: <1:20 {titer}
Myeloperoxidase Ab: <9 U/mL (ref 0.0–9.0)

## 2017-05-10 LAB — B. BURGDORFI ANTIBODIES

## 2017-05-10 LAB — ANA W/REFLEX: ANA: NEGATIVE

## 2017-05-10 LAB — ANGIOTENSIN CONVERTING ENZYME: Angio Convert Enzyme: <15 U/L (ref 14–82)

## 2017-05-10 LAB — CYCLIC CITRUL PEPTIDE ANTIBODY, IGG/IGA: CYCLIC CITRULLIN PEPTIDE AB: 7 U (ref 0–19)

## 2017-05-10 LAB — RHEUMATOID FACTOR: Rhuematoid fact SerPl-aCnc: <10 IU/mL (ref 0.0–13.9)

## 2017-05-12 ENCOUNTER — Telehealth: Payer: Self-pay | Admitting: *Deleted

## 2017-05-12 NOTE — Telephone Encounter (Signed)
I called the patient, I do want her to see the rheumatology position. The etiology of the wrist swelling and pain, significant sensitivity to prednisone suggests an inflammatory problem.

## 2017-05-12 NOTE — Telephone Encounter (Signed)
Called and spoke with patient about unremarkable labs per CW,MD note. Patient verbalized understanding.   She is scheduled to see rheumatologist August 20th. She is wondering if Dr Anne HahnWillis still wants her to see rheumatologist since labs came back unremarkable. If he does, she wants to know if he can try and get her in sooner thatn August 20th?  She is going on vacation June 29th-July 9th.   She also tried to wean off prednisone last night but pain severe. Unable to do this. She went back to taking it.  She is wondering if prednisone use affected her lab results.   Her wrist is still swollen.  Advised I will route questions to CW,MD. We will call back to advise.

## 2017-05-12 NOTE — Telephone Encounter (Signed)
-----   Message from York Spanielharles K Willis, MD sent at 05/11/2017  2:56 PM EDT -----  The blood work results are unremarkable. Please call the patient.  ----- Message ----- From: Nell RangeInterface, [facility] Lab Results In Sent: 05/09/2017   7:43 AM To: York Spanielharles K Willis, MD

## 2017-06-26 DIAGNOSIS — R5382 Chronic fatigue, unspecified: Secondary | ICD-10-CM | POA: Diagnosis not present

## 2017-06-26 DIAGNOSIS — M2550 Pain in unspecified joint: Secondary | ICD-10-CM | POA: Diagnosis not present

## 2017-07-11 DIAGNOSIS — R5382 Chronic fatigue, unspecified: Secondary | ICD-10-CM | POA: Diagnosis not present

## 2017-07-11 DIAGNOSIS — M0579 Rheumatoid arthritis with rheumatoid factor of multiple sites without organ or systems involvement: Secondary | ICD-10-CM | POA: Diagnosis not present

## 2017-07-11 DIAGNOSIS — M2550 Pain in unspecified joint: Secondary | ICD-10-CM | POA: Diagnosis not present

## 2017-09-02 DIAGNOSIS — R5382 Chronic fatigue, unspecified: Secondary | ICD-10-CM | POA: Diagnosis not present

## 2017-09-02 DIAGNOSIS — M0579 Rheumatoid arthritis with rheumatoid factor of multiple sites without organ or systems involvement: Secondary | ICD-10-CM | POA: Diagnosis not present

## 2017-09-02 DIAGNOSIS — M2550 Pain in unspecified joint: Secondary | ICD-10-CM | POA: Diagnosis not present

## 2017-09-30 DIAGNOSIS — M0609 Rheumatoid arthritis without rheumatoid factor, multiple sites: Secondary | ICD-10-CM | POA: Diagnosis not present

## 2017-09-30 DIAGNOSIS — R5382 Chronic fatigue, unspecified: Secondary | ICD-10-CM | POA: Diagnosis not present

## 2017-09-30 DIAGNOSIS — Z23 Encounter for immunization: Secondary | ICD-10-CM | POA: Diagnosis not present

## 2017-09-30 DIAGNOSIS — M2550 Pain in unspecified joint: Secondary | ICD-10-CM | POA: Diagnosis not present

## 2017-10-22 DIAGNOSIS — M0579 Rheumatoid arthritis with rheumatoid factor of multiple sites without organ or systems involvement: Secondary | ICD-10-CM | POA: Diagnosis not present

## 2017-11-04 ENCOUNTER — Other Ambulatory Visit: Payer: Self-pay | Admitting: Family Medicine

## 2017-11-04 DIAGNOSIS — Z139 Encounter for screening, unspecified: Secondary | ICD-10-CM

## 2017-11-24 DIAGNOSIS — M0579 Rheumatoid arthritis with rheumatoid factor of multiple sites without organ or systems involvement: Secondary | ICD-10-CM | POA: Diagnosis not present

## 2017-11-25 DIAGNOSIS — L570 Actinic keratosis: Secondary | ICD-10-CM | POA: Diagnosis not present

## 2017-11-25 DIAGNOSIS — L821 Other seborrheic keratosis: Secondary | ICD-10-CM | POA: Diagnosis not present

## 2017-11-25 DIAGNOSIS — D225 Melanocytic nevi of trunk: Secondary | ICD-10-CM | POA: Diagnosis not present

## 2017-12-01 DIAGNOSIS — R5382 Chronic fatigue, unspecified: Secondary | ICD-10-CM | POA: Diagnosis not present

## 2017-12-01 DIAGNOSIS — M2550 Pain in unspecified joint: Secondary | ICD-10-CM | POA: Diagnosis not present

## 2017-12-01 DIAGNOSIS — M0609 Rheumatoid arthritis without rheumatoid factor, multiple sites: Secondary | ICD-10-CM | POA: Diagnosis not present

## 2017-12-02 DIAGNOSIS — D485 Neoplasm of uncertain behavior of skin: Secondary | ICD-10-CM | POA: Diagnosis not present

## 2017-12-02 DIAGNOSIS — C44321 Squamous cell carcinoma of skin of nose: Secondary | ICD-10-CM | POA: Diagnosis not present

## 2017-12-10 ENCOUNTER — Ambulatory Visit
Admission: RE | Admit: 2017-12-10 | Discharge: 2017-12-10 | Disposition: A | Discharge status: Home / Self Care | Payer: BLUE CROSS/BLUE SHIELD | Source: Ambulatory Visit | Attending: Family Medicine | Admitting: Family Medicine

## 2017-12-10 DIAGNOSIS — Z1231 Encounter for screening mammogram for malignant neoplasm of breast: Secondary | ICD-10-CM | POA: Diagnosis not present

## 2017-12-10 DIAGNOSIS — Z139 Encounter for screening, unspecified: Secondary | ICD-10-CM

## 2017-12-29 DIAGNOSIS — H52203 Unspecified astigmatism, bilateral: Secondary | ICD-10-CM | POA: Diagnosis not present

## 2017-12-29 DIAGNOSIS — H2513 Age-related nuclear cataract, bilateral: Secondary | ICD-10-CM | POA: Diagnosis not present

## 2017-12-29 DIAGNOSIS — H524 Presbyopia: Secondary | ICD-10-CM | POA: Diagnosis not present

## 2018-01-07 DIAGNOSIS — C44321 Squamous cell carcinoma of skin of nose: Secondary | ICD-10-CM | POA: Diagnosis not present

## 2018-01-19 DIAGNOSIS — M0579 Rheumatoid arthritis with rheumatoid factor of multiple sites without organ or systems involvement: Secondary | ICD-10-CM | POA: Diagnosis not present

## 2018-02-25 DIAGNOSIS — D485 Neoplasm of uncertain behavior of skin: Secondary | ICD-10-CM | POA: Diagnosis not present

## 2018-02-25 DIAGNOSIS — L570 Actinic keratosis: Secondary | ICD-10-CM | POA: Diagnosis not present

## 2018-03-11 DIAGNOSIS — M0579 Rheumatoid arthritis with rheumatoid factor of multiple sites without organ or systems involvement: Secondary | ICD-10-CM | POA: Diagnosis not present

## 2018-03-11 DIAGNOSIS — M150 Primary generalized (osteo)arthritis: Secondary | ICD-10-CM | POA: Diagnosis not present

## 2018-03-11 DIAGNOSIS — Z79899 Other long term (current) drug therapy: Secondary | ICD-10-CM | POA: Diagnosis not present

## 2018-03-11 DIAGNOSIS — M2550 Pain in unspecified joint: Secondary | ICD-10-CM | POA: Diagnosis not present

## 2018-03-16 DIAGNOSIS — M0579 Rheumatoid arthritis with rheumatoid factor of multiple sites without organ or systems involvement: Secondary | ICD-10-CM | POA: Diagnosis not present

## 2018-05-06 DIAGNOSIS — E782 Mixed hyperlipidemia: Secondary | ICD-10-CM | POA: Diagnosis not present

## 2018-05-06 DIAGNOSIS — Z0000 Encounter for general adult medical examination without abnormal findings: Secondary | ICD-10-CM | POA: Diagnosis not present

## 2018-05-11 DIAGNOSIS — Z79899 Other long term (current) drug therapy: Secondary | ICD-10-CM | POA: Diagnosis not present

## 2018-05-11 DIAGNOSIS — M0579 Rheumatoid arthritis with rheumatoid factor of multiple sites without organ or systems involvement: Secondary | ICD-10-CM | POA: Diagnosis not present

## 2018-06-10 DIAGNOSIS — M0579 Rheumatoid arthritis with rheumatoid factor of multiple sites without organ or systems involvement: Secondary | ICD-10-CM | POA: Diagnosis not present

## 2018-06-10 DIAGNOSIS — Z79899 Other long term (current) drug therapy: Secondary | ICD-10-CM | POA: Diagnosis not present

## 2018-06-10 DIAGNOSIS — M2550 Pain in unspecified joint: Secondary | ICD-10-CM | POA: Diagnosis not present

## 2018-06-17 DIAGNOSIS — Z23 Encounter for immunization: Secondary | ICD-10-CM | POA: Diagnosis not present

## 2018-07-06 DIAGNOSIS — M0579 Rheumatoid arthritis with rheumatoid factor of multiple sites without organ or systems involvement: Secondary | ICD-10-CM | POA: Diagnosis not present

## 2018-07-06 DIAGNOSIS — Z79899 Other long term (current) drug therapy: Secondary | ICD-10-CM | POA: Diagnosis not present

## 2018-07-13 DIAGNOSIS — D1801 Hemangioma of skin and subcutaneous tissue: Secondary | ICD-10-CM | POA: Diagnosis not present

## 2018-07-13 DIAGNOSIS — C44321 Squamous cell carcinoma of skin of nose: Secondary | ICD-10-CM | POA: Diagnosis not present

## 2018-07-13 DIAGNOSIS — B351 Tinea unguium: Secondary | ICD-10-CM | POA: Diagnosis not present

## 2018-07-13 DIAGNOSIS — D485 Neoplasm of uncertain behavior of skin: Secondary | ICD-10-CM | POA: Diagnosis not present

## 2018-07-13 DIAGNOSIS — L989 Disorder of the skin and subcutaneous tissue, unspecified: Secondary | ICD-10-CM | POA: Diagnosis not present

## 2018-07-13 DIAGNOSIS — L814 Other melanin hyperpigmentation: Secondary | ICD-10-CM | POA: Diagnosis not present

## 2018-07-13 DIAGNOSIS — Z85828 Personal history of other malignant neoplasm of skin: Secondary | ICD-10-CM | POA: Diagnosis not present

## 2018-08-31 DIAGNOSIS — Z79899 Other long term (current) drug therapy: Secondary | ICD-10-CM | POA: Diagnosis not present

## 2018-08-31 DIAGNOSIS — M0579 Rheumatoid arthritis with rheumatoid factor of multiple sites without organ or systems involvement: Secondary | ICD-10-CM | POA: Diagnosis not present

## 2018-09-03 DIAGNOSIS — D0439 Carcinoma in situ of skin of other parts of face: Secondary | ICD-10-CM | POA: Diagnosis not present

## 2018-09-15 DIAGNOSIS — Z79899 Other long term (current) drug therapy: Secondary | ICD-10-CM | POA: Diagnosis not present

## 2018-09-15 DIAGNOSIS — M0579 Rheumatoid arthritis with rheumatoid factor of multiple sites without organ or systems involvement: Secondary | ICD-10-CM | POA: Diagnosis not present

## 2018-09-15 DIAGNOSIS — M2550 Pain in unspecified joint: Secondary | ICD-10-CM | POA: Diagnosis not present

## 2018-10-14 DIAGNOSIS — M170 Bilateral primary osteoarthritis of knee: Secondary | ICD-10-CM | POA: Diagnosis not present

## 2018-10-20 DIAGNOSIS — L989 Disorder of the skin and subcutaneous tissue, unspecified: Secondary | ICD-10-CM | POA: Diagnosis not present

## 2018-10-20 DIAGNOSIS — B351 Tinea unguium: Secondary | ICD-10-CM | POA: Diagnosis not present

## 2018-10-20 DIAGNOSIS — Z85828 Personal history of other malignant neoplasm of skin: Secondary | ICD-10-CM | POA: Diagnosis not present

## 2018-10-20 DIAGNOSIS — L820 Inflamed seborrheic keratosis: Secondary | ICD-10-CM | POA: Diagnosis not present

## 2018-10-26 DIAGNOSIS — M0579 Rheumatoid arthritis with rheumatoid factor of multiple sites without organ or systems involvement: Secondary | ICD-10-CM | POA: Diagnosis not present

## 2018-10-26 DIAGNOSIS — Z79899 Other long term (current) drug therapy: Secondary | ICD-10-CM | POA: Diagnosis not present

## 2018-11-19 ENCOUNTER — Other Ambulatory Visit: Payer: Self-pay | Admitting: Family Medicine

## 2018-11-19 DIAGNOSIS — Z1231 Encounter for screening mammogram for malignant neoplasm of breast: Secondary | ICD-10-CM

## 2018-11-23 IMAGING — MG 2D DIGITAL SCREENING BILATERAL MAMMOGRAM WITH 3D TOMO WITH CAD
9 of 12 series · 9 of 28 positions shown · non-contrast
Comparison: Previous exam(s).

CLINICAL DATA: Screening.

EXAM:
2D DIGITAL SCREENING BILATERAL MAMMOGRAM WITH 3D TOMO WITH CAD

[R MLO]
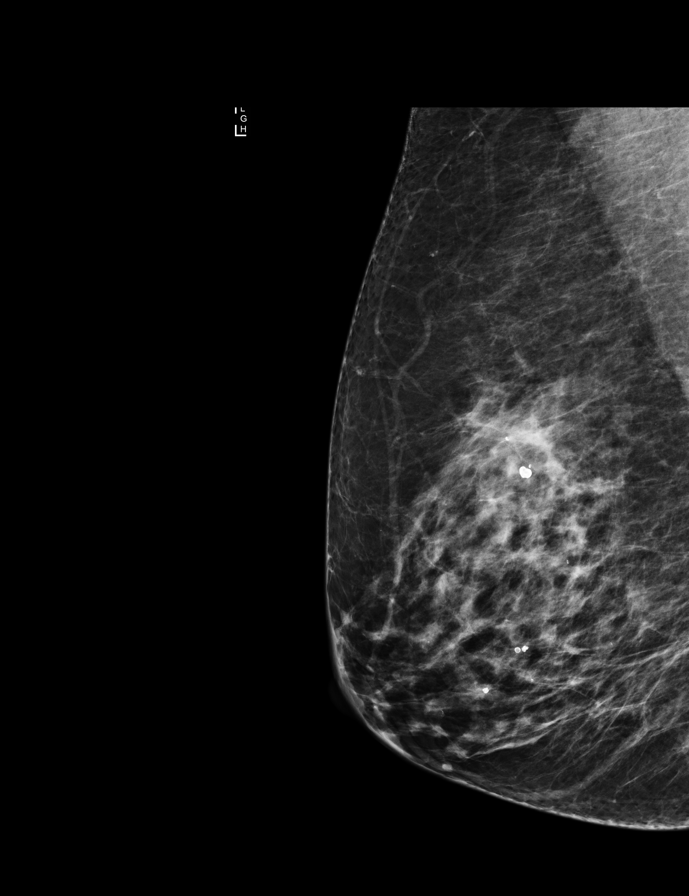

[R CC synth-2D]
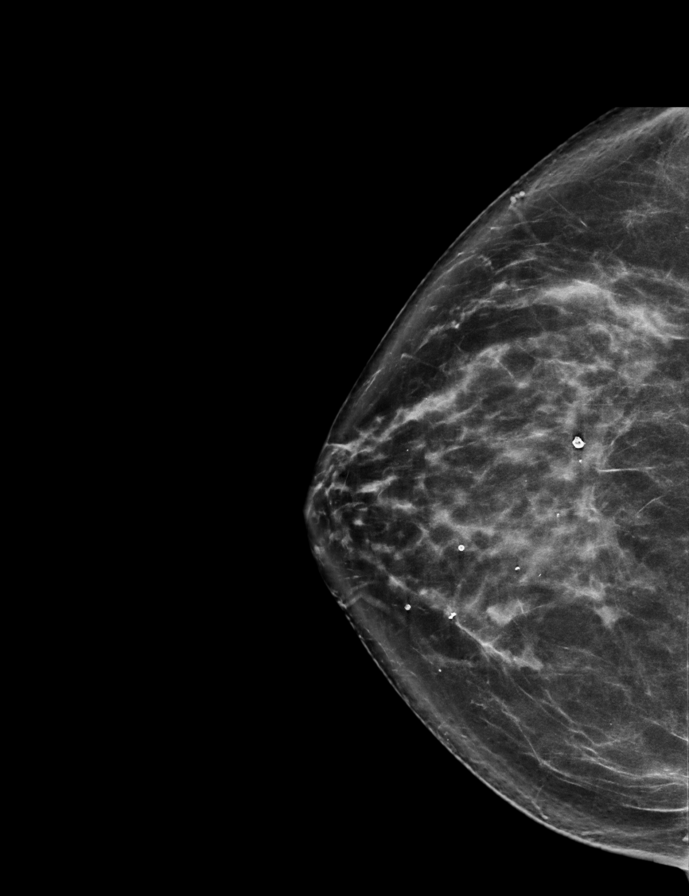

[L CC]
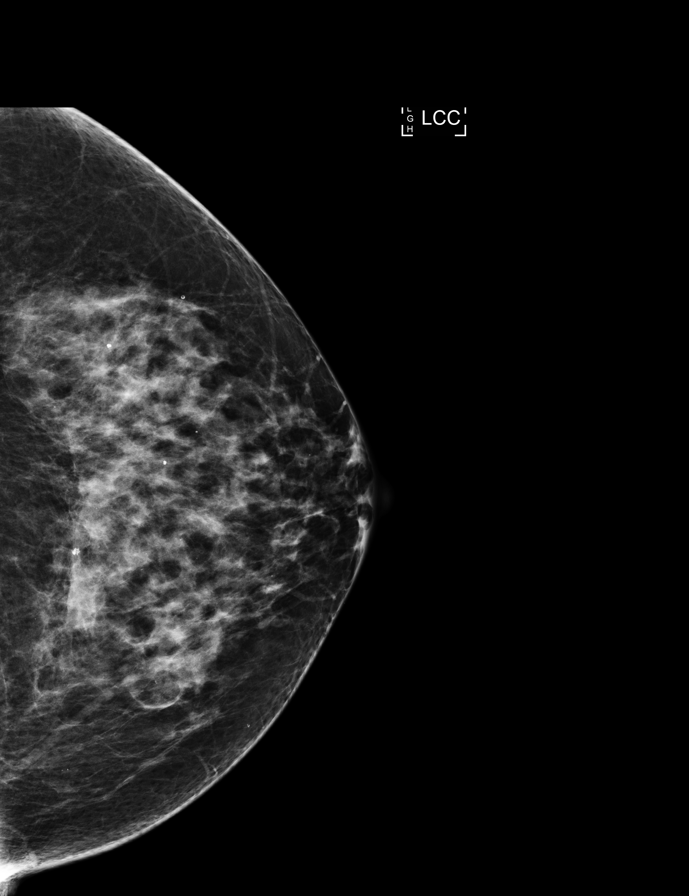

[R CC]
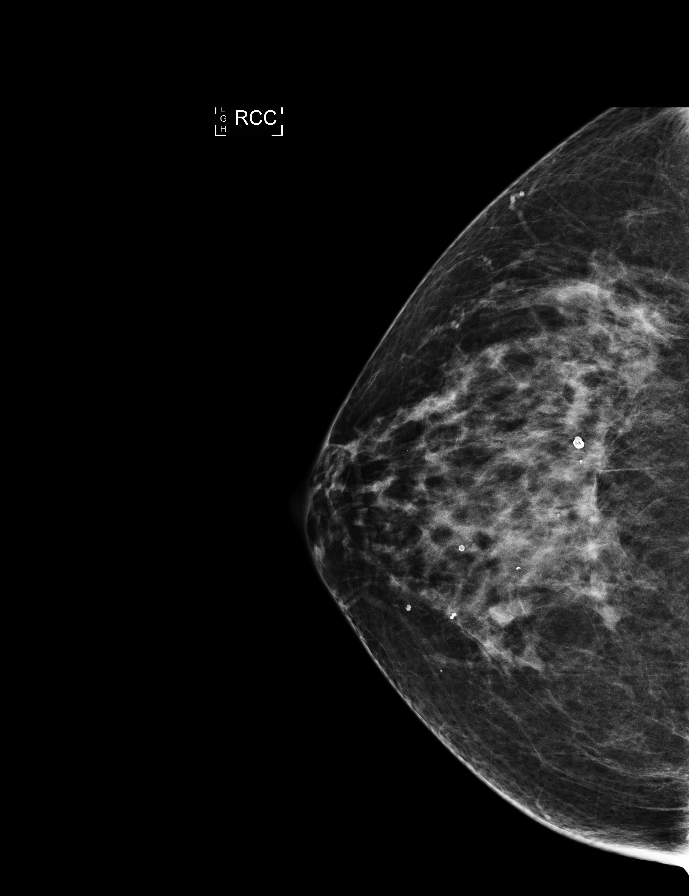

[R MLO synth-2D]
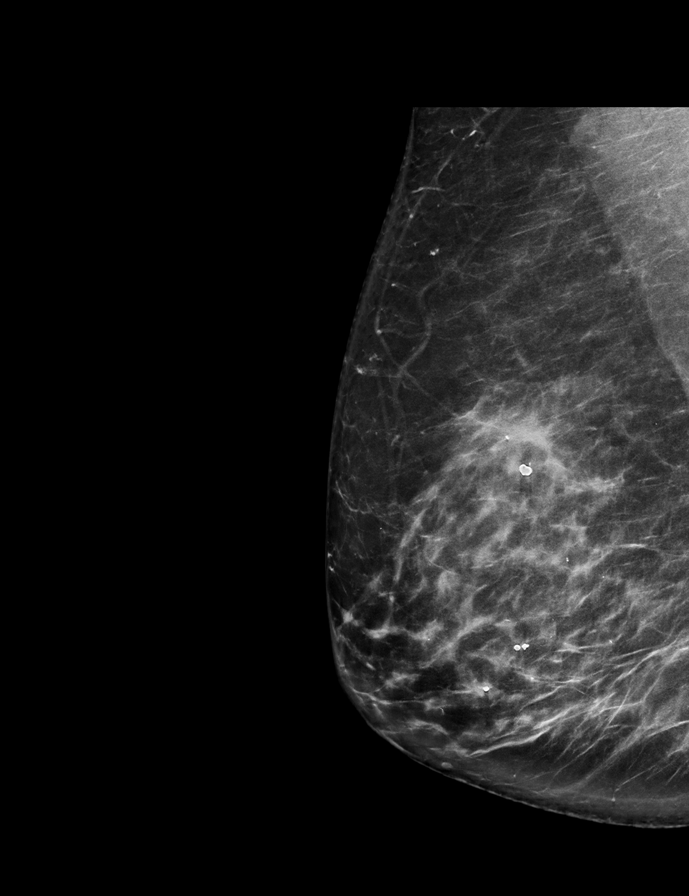

[L MLO synth-2D]
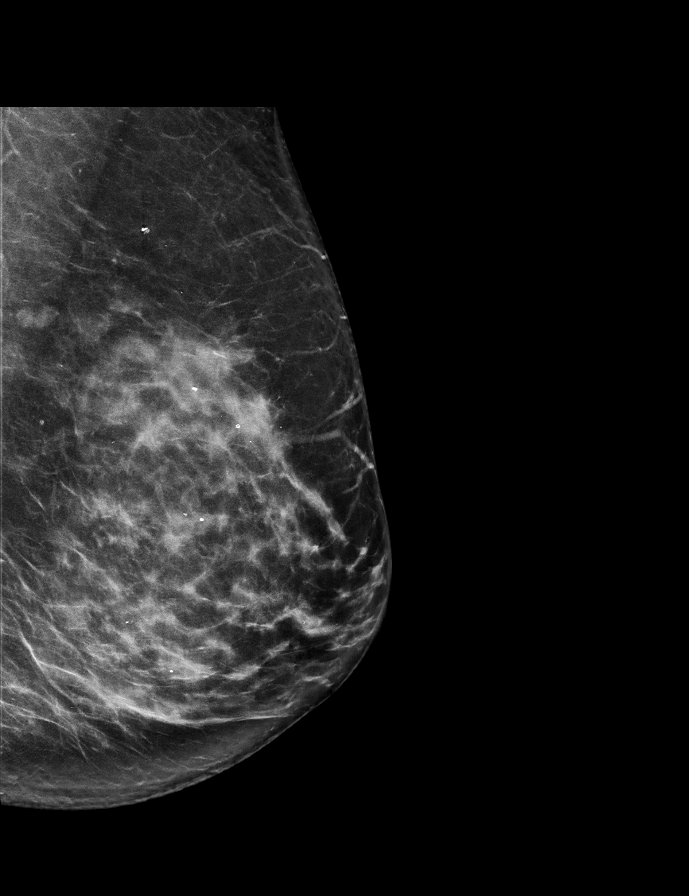

[L CC synth-2D]
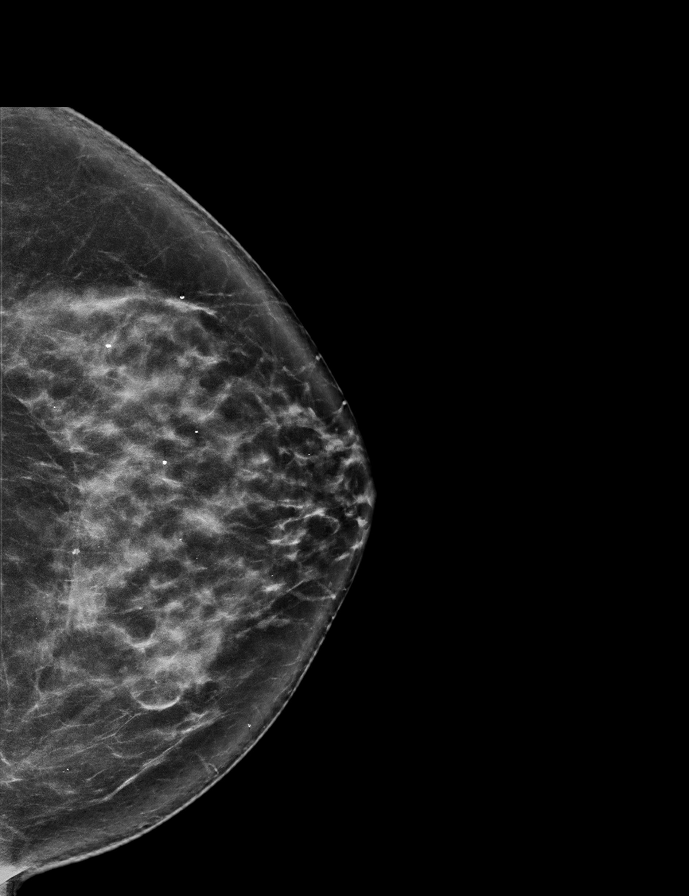

[L MLO]
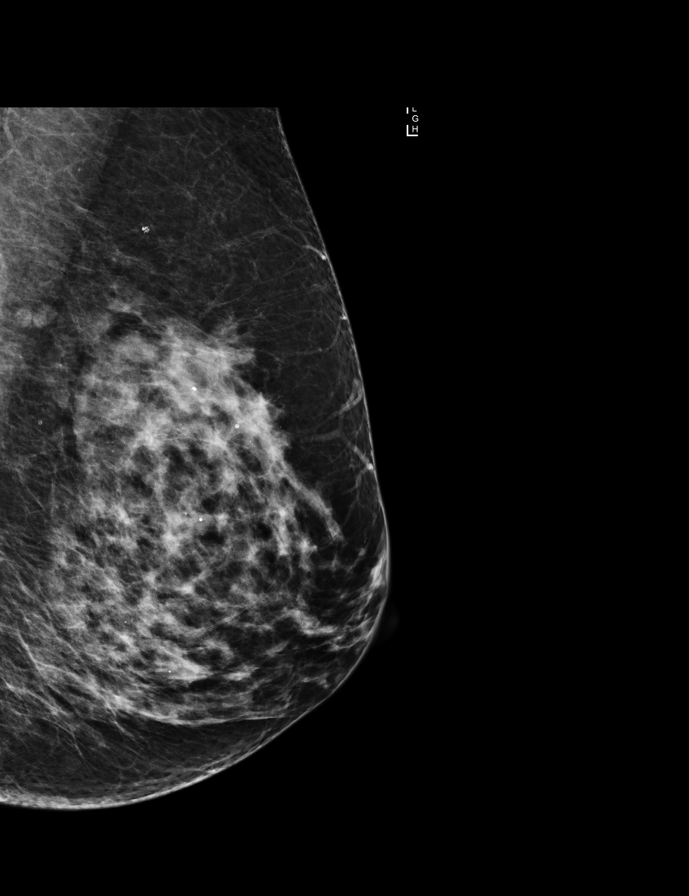

[L CC tomo · tomo slice 39/76.0]
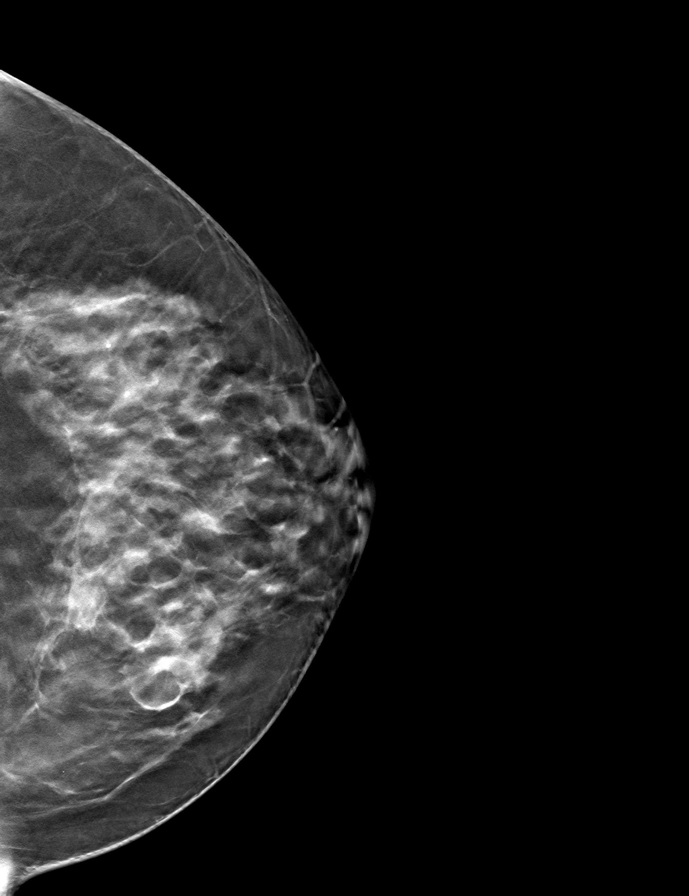

[9 of 28 positions shown; findings below may reference images not displayed]

ACR Breast Density Category c: The breast tissue is heterogeneously
dense, which may obscure small masses.
FINDINGS: There are no findings suspicious for malignancy. Images were
processed with CAD.
IMPRESSION: No mammographic evidence of malignancy. A result letter of this
screening mammogram will be mailed directly to the patient.

RECOMMENDATION:
Screening mammogram in one year. (Code:UA-[id number])

BI-RADS CATEGORY  1: Negative.

## 2018-12-16 ENCOUNTER — Ambulatory Visit
Admission: RE | Admit: 2018-12-16 | Discharge: 2018-12-16 | Disposition: A | Discharge status: Home / Self Care | Payer: BLUE CROSS/BLUE SHIELD | Source: Ambulatory Visit | Attending: Family Medicine | Admitting: Family Medicine

## 2018-12-16 ENCOUNTER — Ambulatory Visit: Payer: BLUE CROSS/BLUE SHIELD

## 2018-12-16 DIAGNOSIS — Z1231 Encounter for screening mammogram for malignant neoplasm of breast: Secondary | ICD-10-CM | POA: Diagnosis not present

## 2018-12-21 DIAGNOSIS — M0579 Rheumatoid arthritis with rheumatoid factor of multiple sites without organ or systems involvement: Secondary | ICD-10-CM | POA: Diagnosis not present

## 2018-12-22 DIAGNOSIS — M720 Palmar fascial fibromatosis [Dupuytren]: Secondary | ICD-10-CM | POA: Diagnosis not present

## 2018-12-22 DIAGNOSIS — R200 Anesthesia of skin: Secondary | ICD-10-CM | POA: Diagnosis not present

## 2018-12-23 ENCOUNTER — Ambulatory Visit (INDEPENDENT_AMBULATORY_CARE_PROVIDER_SITE_OTHER): Payer: BLUE CROSS/BLUE SHIELD | Admitting: Neurology

## 2018-12-23 ENCOUNTER — Encounter: Payer: Self-pay | Admitting: Neurology

## 2018-12-23 VITALS — BP 134/75 | HR 76 | Ht 68.0 in | Wt 153.0 lb

## 2018-12-23 DIAGNOSIS — R202 Paresthesia of skin: Secondary | ICD-10-CM | POA: Diagnosis not present

## 2018-12-23 NOTE — Progress Notes (Signed)
Reason for visit: Facial numbness  Referring physician: Dr. Elmon Else [name] is a 68 y.o. female  History of present illness:  Janice Faulkner is a 68 year old right-handed white female with a history of rheumatoid arthritis.  The patient has been treated with Simponi with excellent results over the last 1 year.  On 25 November 2018, the patient noted sudden onset of left tongue numbness that has been persistent since that time.  Within 2 weeks of onset of the tongue numbness, she began having numbness of the left lower lip and chin area with occasional episodes of numbness spreading into the lower face on the left.  The patient has not noted any other new symptoms.  She denies any speech or swallowing problems, double vision, loss of vision, headache, or dizziness.  She denies numbness or weakness of the arms or legs, she has not had any change in coordination or balance with walking.  She has not had any discomfort with the sensory changes above.  The patient is sent to this office for further evaluation.  Past Medical History:  Diagnosis Date  . Hypertension   . Infertility, female   . Menopausal symptoms   . Migraines   . Mixed hyperlipidemia   . Numbness     Past Surgical History:  Procedure Laterality Date  . ABDOMINAL HYSTERECTOMY  1998   TAH/BSO--d/t fibroids  . APPENDECTOMY    . BREAST BIOPSY    . TONSILLECTOMY AND ADENOIDECTOMY      Family History  Problem Relation Age of Onset  . Lupus Mother   . Osteoporosis Mother   . Diabetes Father   . Hypertension Father   . Breast cancer Sister 4  . Hypertension Brother     Social history:  reports that she has never smoked. She has never used smokeless tobacco. She reports that she does not drink alcohol or use drugs.  Medications:  Prior to Admission medications   Medication Sig Start Date End Date Taking? Authorizing Provider  lisinopril (PRINIVIL,ZESTRIL) 10 MG tablet Take 10 mg by mouth daily.   Yes [provider]  meloxicam (MOBIC) 15 MG tablet  12/04/18  Yes [provider]  golimumab (SIMPONI ARIA) 50 MG/4ML SOLN injection Simponi ARIA 12.5 mg/mL intravenous solution  Inject by intravenous route.    [provider]     No Known Allergies  ROS:  Out of a complete 14 system review of symptoms, the patient complains only of the following symptoms, and all other reviewed systems are negative.  Numbness  Blood pressure 134/75, pulse 76, height 5\' 8"  (1.727 m), weight 153 lb (69.4 kg), last menstrual period 11/18/1996.  Physical Exam  General: The patient is alert and cooperative at the time of the examination.  Eyes: Pupils are equal, round, and reactive to light. Discs are flat bilaterally.  Neck: The neck is supple, no carotid bruits are noted.  Respiratory: The respiratory examination is clear.  Cardiovascular: The cardiovascular examination reveals a regular rate and rhythm, no obvious murmurs or rubs are noted.  Skin: Extremities are without significant edema.  Neurologic Exam  Mental status: The patient is alert and oriented x 3 at the time of the examination. The patient has apparent normal recent and remote memory, with an apparently normal attention span and concentration ability.  Cranial nerves: Facial symmetry is present. There is good sensation of the face to pinprick and soft touch bilaterally. The strength of the facial muscles and the muscles to  head turning and shoulder shrug are normal bilaterally. Speech is well enunciated, no aphasia or dysarthria is noted. Extraocular movements are full. Visual fields are full. The tongue is midline, and the patient has symmetric elevation of the soft palate. No obvious hearing deficits are noted.  Motor: The motor testing reveals 5 over 5 strength of all 4 extremities. Good symmetric motor tone is noted throughout.  Sensory: Sensory testing is intact to pinprick, soft touch, vibration sensation, and  position sense on all 4 extremities. No evidence of extinction is noted.  Coordination: Cerebellar testing reveals good finger-nose-finger and heel-to-shin bilaterally.  Gait and station: Gait is normal. Tandem gait is normal. Romberg is negative. No drift is seen.  Reflexes: Deep tendon reflexes are symmetric and normal bilaterally. Toes are downgoing bilaterally.   Assessment/Plan:  1.  Left facial numbness  2.  Rheumatoid arthritis  The patient has had new sensory complaints involving the tongue and lower face.  The patient will be sent for MRI of the brain with and without gadolinium enhancement.  If the study is unremarkable, we will follow the patient on a conservative basis.  Neurologic examination otherwise is unremarkable.  Janice Palau MD 12/23/2018 2:56 PM  [facility] [address] [place], [place] [address]  Phone [phone] Fax [phone]

## 2018-12-29 ENCOUNTER — Telehealth: Payer: Self-pay | Admitting: Neurology

## 2018-12-29 NOTE — Telephone Encounter (Signed)
MR Brain w/wo contrast Dr. Lockie Mola Auth: NPR Ref # [id number]. Patient is scheduled at [facility] for 12/30/18

## 2018-12-30 ENCOUNTER — Ambulatory Visit: Payer: BLUE CROSS/BLUE SHIELD

## 2018-12-30 DIAGNOSIS — R202 Paresthesia of skin: Secondary | ICD-10-CM

## 2018-12-30 MED ORDER — GADOBENATE DIMEGLUMINE 529 MG/ML IV SOLN
15.0000 mL | Freq: Once | INTRAVENOUS | Status: AC | PRN
  Administered 2018-12-30: 15 mL via INTRAVENOUS

## 2018-12-31 DIAGNOSIS — H33301 Unspecified retinal break, right eye: Secondary | ICD-10-CM | POA: Diagnosis not present

## 2018-12-31 DIAGNOSIS — H52203 Unspecified astigmatism, bilateral: Secondary | ICD-10-CM | POA: Diagnosis not present

## 2018-12-31 DIAGNOSIS — H2513 Age-related nuclear cataract, bilateral: Secondary | ICD-10-CM | POA: Diagnosis not present

## 2019-01-01 ENCOUNTER — Telehealth: Payer: Self-pay | Admitting: Neurology

## 2019-01-01 NOTE — Telephone Encounter (Signed)
I called the patient.  The MRI basically shows 2 small spots, one in the right cerebellum, and one in the left periventricular white matter, the spots are nonspecific, I would recommend following this over time, recheck another MRI of the brain in a year, sooner if new symptoms are noted.    MRI brain 01/01/19:  IMPRESSION:   MRI brain (with and without) demonstrating: - Few small, round and ovoid, periventricular, subcortical and right cerebellar T2 hyperintensities. No abnormal lesions are seen on post contrast views. These findings are non-specific and considerations include autoimmune, inflammatory, post-infectious, microvascular ischemic or migraine associated etiologies.  - No acute findings.

## 2019-01-12 ENCOUNTER — Telehealth: Payer: Self-pay | Admitting: Neurology

## 2019-01-12 NOTE — Telephone Encounter (Signed)
I called the patient.  I discussed the MRI results, no etiology for her tongue numbness was noted.  It is possible she may have a small mononeuropathy associated with her rheumatoid arthritis, we will plan on checking MRI of the brain in early December 2020.  The patient will call if she has any new symptoms.

## 2019-01-12 NOTE — Telephone Encounter (Signed)
Pt returned call. Could not hold and would like a call back as soon as available.

## 2019-01-12 NOTE — Telephone Encounter (Signed)
Janice Spaniel, MD   Note    I called the patient.  I discussed the MRI results, no etiology for her tongue numbness was noted.  It is possible she may have a small mononeuropathy associated with her rheumatoid arthritis, we will plan on checking MRI of the brain in early December 2020.  The patient will call if she has any new symptoms.     Patient was called by Dr. Anne Hahn directly and was advised of message above. I called the patient and states she has no further questions at this time.

## 2019-02-09 ENCOUNTER — Encounter

## 2019-02-09 ENCOUNTER — Ambulatory Visit: Payer: BLUE CROSS/BLUE SHIELD | Admitting: Neurology

## 2019-02-15 DIAGNOSIS — M0579 Rheumatoid arthritis with rheumatoid factor of multiple sites without organ or systems involvement: Secondary | ICD-10-CM | POA: Diagnosis not present

## 2019-03-24 DIAGNOSIS — M2550 Pain in unspecified joint: Secondary | ICD-10-CM | POA: Diagnosis not present

## 2019-03-24 DIAGNOSIS — Z79899 Other long term (current) drug therapy: Secondary | ICD-10-CM | POA: Diagnosis not present

## 2019-03-24 DIAGNOSIS — M0579 Rheumatoid arthritis with rheumatoid factor of multiple sites without organ or systems involvement: Secondary | ICD-10-CM | POA: Diagnosis not present

## 2019-04-08 DIAGNOSIS — M7989 Other specified soft tissue disorders: Secondary | ICD-10-CM | POA: Diagnosis not present

## 2019-04-08 DIAGNOSIS — M720 Palmar fascial fibromatosis [Dupuytren]: Secondary | ICD-10-CM | POA: Diagnosis not present

## 2019-04-13 DIAGNOSIS — M0579 Rheumatoid arthritis with rheumatoid factor of multiple sites without organ or systems involvement: Secondary | ICD-10-CM | POA: Diagnosis not present

## 2019-04-13 DIAGNOSIS — Z79899 Other long term (current) drug therapy: Secondary | ICD-10-CM | POA: Diagnosis not present

## 2019-04-20 DIAGNOSIS — H25812 Combined forms of age-related cataract, left eye: Secondary | ICD-10-CM | POA: Diagnosis not present

## 2019-04-20 DIAGNOSIS — H2512 Age-related nuclear cataract, left eye: Secondary | ICD-10-CM | POA: Diagnosis not present

## 2019-04-26 DIAGNOSIS — L821 Other seborrheic keratosis: Secondary | ICD-10-CM | POA: Diagnosis not present

## 2019-04-26 DIAGNOSIS — D229 Melanocytic nevi, unspecified: Secondary | ICD-10-CM | POA: Diagnosis not present

## 2019-04-26 DIAGNOSIS — L819 Disorder of pigmentation, unspecified: Secondary | ICD-10-CM | POA: Diagnosis not present

## 2019-04-26 DIAGNOSIS — B351 Tinea unguium: Secondary | ICD-10-CM | POA: Diagnosis not present

## 2019-05-31 DIAGNOSIS — Z0000 Encounter for general adult medical examination without abnormal findings: Secondary | ICD-10-CM | POA: Diagnosis not present

## 2019-06-03 DIAGNOSIS — Z23 Encounter for immunization: Secondary | ICD-10-CM | POA: Diagnosis not present

## 2019-06-03 DIAGNOSIS — E782 Mixed hyperlipidemia: Secondary | ICD-10-CM | POA: Diagnosis not present

## 2019-06-09 DIAGNOSIS — M0579 Rheumatoid arthritis with rheumatoid factor of multiple sites without organ or systems involvement: Secondary | ICD-10-CM | POA: Diagnosis not present

## 2019-08-04 DIAGNOSIS — M0579 Rheumatoid arthritis with rheumatoid factor of multiple sites without organ or systems involvement: Secondary | ICD-10-CM | POA: Diagnosis not present

## 2019-08-21 DIAGNOSIS — Z23 Encounter for immunization: Secondary | ICD-10-CM | POA: Diagnosis not present

## 2019-09-23 DIAGNOSIS — M0579 Rheumatoid arthritis with rheumatoid factor of multiple sites without organ or systems involvement: Secondary | ICD-10-CM | POA: Diagnosis not present

## 2019-09-23 DIAGNOSIS — M150 Primary generalized (osteo)arthritis: Secondary | ICD-10-CM | POA: Diagnosis not present

## 2019-09-23 DIAGNOSIS — Z79899 Other long term (current) drug therapy: Secondary | ICD-10-CM | POA: Diagnosis not present

## 2019-09-23 DIAGNOSIS — M2550 Pain in unspecified joint: Secondary | ICD-10-CM | POA: Diagnosis not present

## 2019-09-29 DIAGNOSIS — M0579 Rheumatoid arthritis with rheumatoid factor of multiple sites without organ or systems involvement: Secondary | ICD-10-CM | POA: Diagnosis not present

## 2019-10-25 DIAGNOSIS — L570 Actinic keratosis: Secondary | ICD-10-CM | POA: Diagnosis not present

## 2019-10-25 DIAGNOSIS — I781 Nevus, non-neoplastic: Secondary | ICD-10-CM | POA: Diagnosis not present

## 2019-10-25 DIAGNOSIS — L603 Nail dystrophy: Secondary | ICD-10-CM | POA: Diagnosis not present

## 2019-11-01 DIAGNOSIS — L603 Nail dystrophy: Secondary | ICD-10-CM | POA: Diagnosis not present

## 2019-11-08 ENCOUNTER — Other Ambulatory Visit: Payer: Self-pay | Admitting: Family Medicine

## 2019-11-08 DIAGNOSIS — Z1231 Encounter for screening mammogram for malignant neoplasm of breast: Secondary | ICD-10-CM

## 2019-11-24 DIAGNOSIS — Z111 Encounter for screening for respiratory tuberculosis: Secondary | ICD-10-CM | POA: Diagnosis not present

## 2019-11-24 DIAGNOSIS — Z79899 Other long term (current) drug therapy: Secondary | ICD-10-CM | POA: Diagnosis not present

## 2019-11-24 DIAGNOSIS — M0579 Rheumatoid arthritis with rheumatoid factor of multiple sites without organ or systems involvement: Secondary | ICD-10-CM | POA: Diagnosis not present

## 2019-12-13 DIAGNOSIS — E782 Mixed hyperlipidemia: Secondary | ICD-10-CM | POA: Diagnosis not present

## 2019-12-13 DIAGNOSIS — Z7189 Other specified counseling: Secondary | ICD-10-CM | POA: Diagnosis not present

## 2019-12-13 DIAGNOSIS — I10 Essential (primary) hypertension: Secondary | ICD-10-CM | POA: Diagnosis not present

## 2019-12-13 DIAGNOSIS — M069 Rheumatoid arthritis, unspecified: Secondary | ICD-10-CM | POA: Diagnosis not present

## 2019-12-15 ENCOUNTER — Ambulatory Visit: Payer: BLUE CROSS/BLUE SHIELD

## 2019-12-22 ENCOUNTER — Ambulatory Visit
Admission: RE | Admit: 2019-12-22 | Discharge: 2019-12-22 | Disposition: A | Discharge status: Home / Self Care | Payer: BC Managed Care – PPO | Source: Ambulatory Visit | Attending: Family Medicine | Admitting: Family Medicine

## 2019-12-22 ENCOUNTER — Other Ambulatory Visit: Payer: Self-pay

## 2019-12-22 DIAGNOSIS — Z1231 Encounter for screening mammogram for malignant neoplasm of breast: Secondary | ICD-10-CM | POA: Diagnosis not present

## 2019-12-26 ENCOUNTER — Ambulatory Visit: Payer: BLUE CROSS/BLUE SHIELD

## 2020-01-24 DIAGNOSIS — M0579 Rheumatoid arthritis with rheumatoid factor of multiple sites without organ or systems involvement: Secondary | ICD-10-CM | POA: Diagnosis not present

## 2020-01-31 DIAGNOSIS — Z961 Presence of intraocular lens: Secondary | ICD-10-CM | POA: Diagnosis not present

## 2020-01-31 DIAGNOSIS — H26492 Other secondary cataract, left eye: Secondary | ICD-10-CM | POA: Diagnosis not present

## 2020-01-31 DIAGNOSIS — H2511 Age-related nuclear cataract, right eye: Secondary | ICD-10-CM | POA: Diagnosis not present

## 2020-02-08 DIAGNOSIS — L570 Actinic keratosis: Secondary | ICD-10-CM | POA: Diagnosis not present

## 2020-02-08 DIAGNOSIS — D239 Other benign neoplasm of skin, unspecified: Secondary | ICD-10-CM | POA: Diagnosis not present

## 2020-02-08 DIAGNOSIS — Z85828 Personal history of other malignant neoplasm of skin: Secondary | ICD-10-CM | POA: Diagnosis not present

## 2020-02-08 DIAGNOSIS — L309 Dermatitis, unspecified: Secondary | ICD-10-CM | POA: Diagnosis not present

## 2020-02-08 DIAGNOSIS — L578 Other skin changes due to chronic exposure to nonionizing radiation: Secondary | ICD-10-CM | POA: Diagnosis not present

## 2020-02-29 DIAGNOSIS — H26492 Other secondary cataract, left eye: Secondary | ICD-10-CM | POA: Diagnosis not present

## 2020-03-20 DIAGNOSIS — M0579 Rheumatoid arthritis with rheumatoid factor of multiple sites without organ or systems involvement: Secondary | ICD-10-CM | POA: Diagnosis not present

## 2020-03-22 DIAGNOSIS — M150 Primary generalized (osteo)arthritis: Secondary | ICD-10-CM | POA: Diagnosis not present

## 2020-03-22 DIAGNOSIS — M0579 Rheumatoid arthritis with rheumatoid factor of multiple sites without organ or systems involvement: Secondary | ICD-10-CM | POA: Diagnosis not present

## 2020-03-22 DIAGNOSIS — M2550 Pain in unspecified joint: Secondary | ICD-10-CM | POA: Diagnosis not present

## 2020-12-04 IMAGING — MG DIGITAL SCREENING BILAT W/ TOMO W/ CAD
8 series · 9 of 24 positions shown · non-contrast
Comparison: Previous exam(s).

CLINICAL DATA: Screening.

EXAM:
DIGITAL SCREENING BILATERAL MAMMOGRAM WITH TOMO AND CAD

[L CC synth-2D]
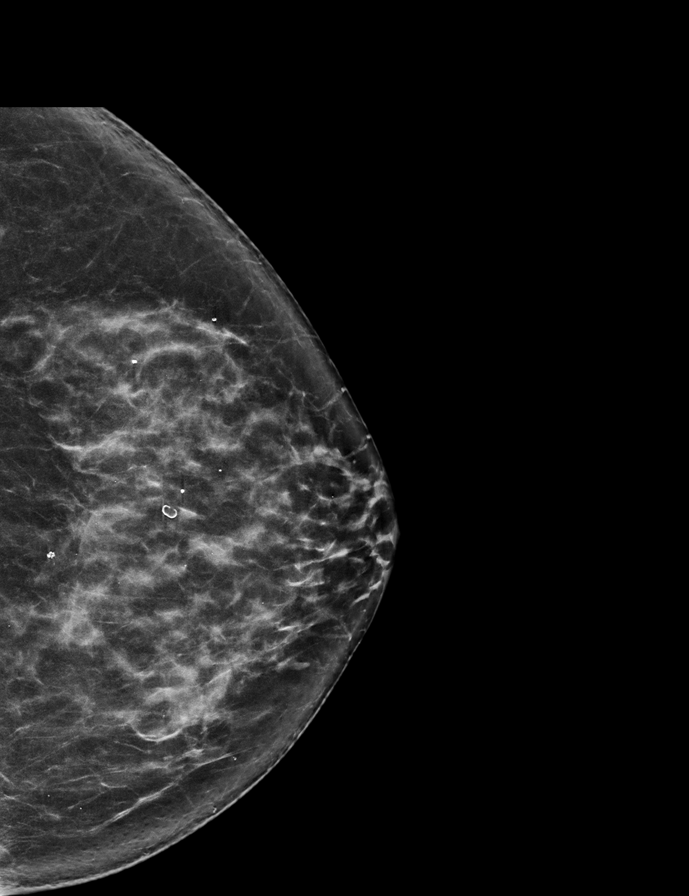

[L MLO synth-2D]
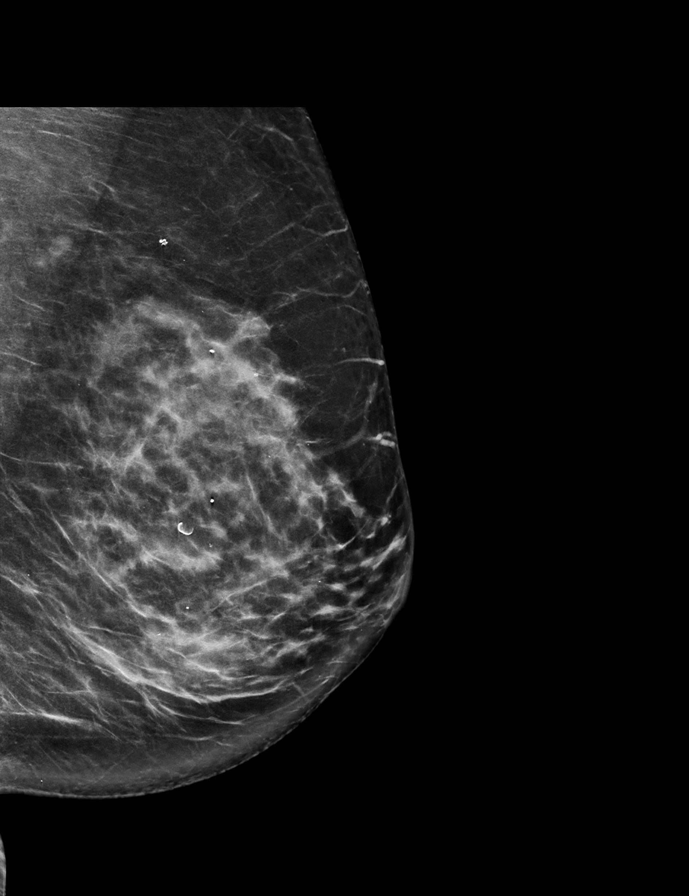

[R CC synth-2D]
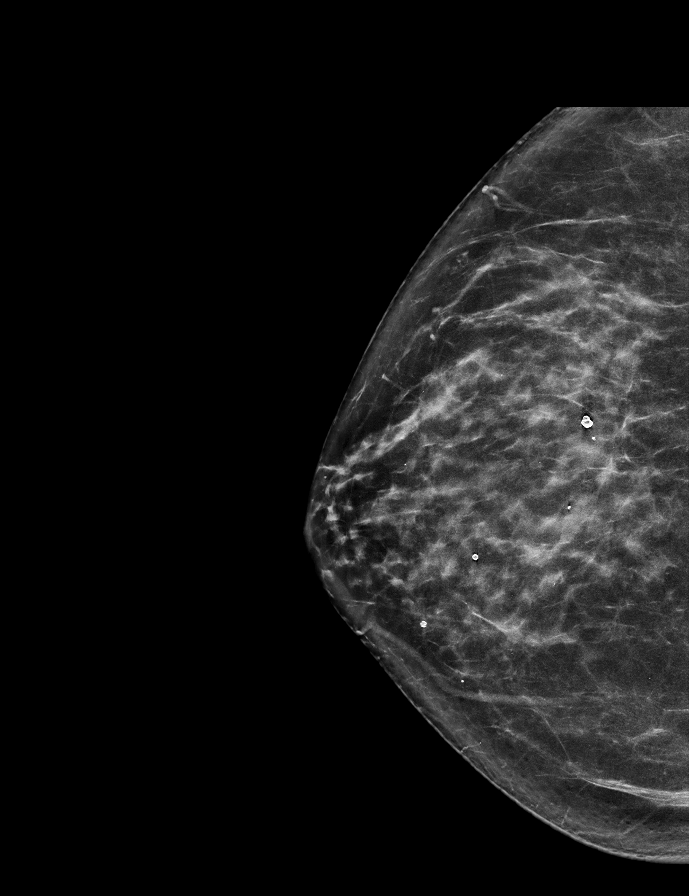

[R MLO synth-2D]
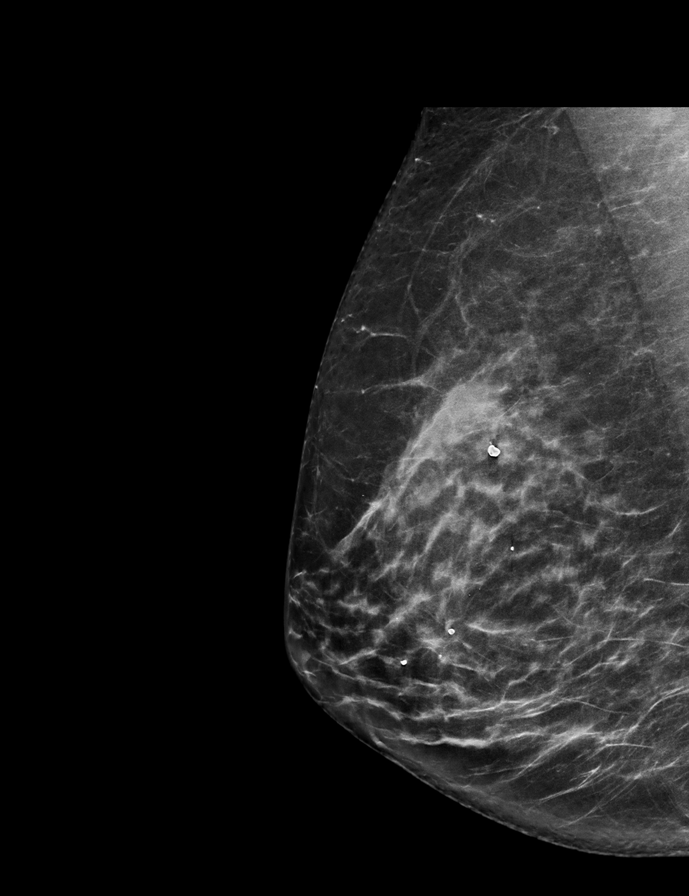

[L MLO tomo · 2 of 76 frames shown]
[frame 25/76]
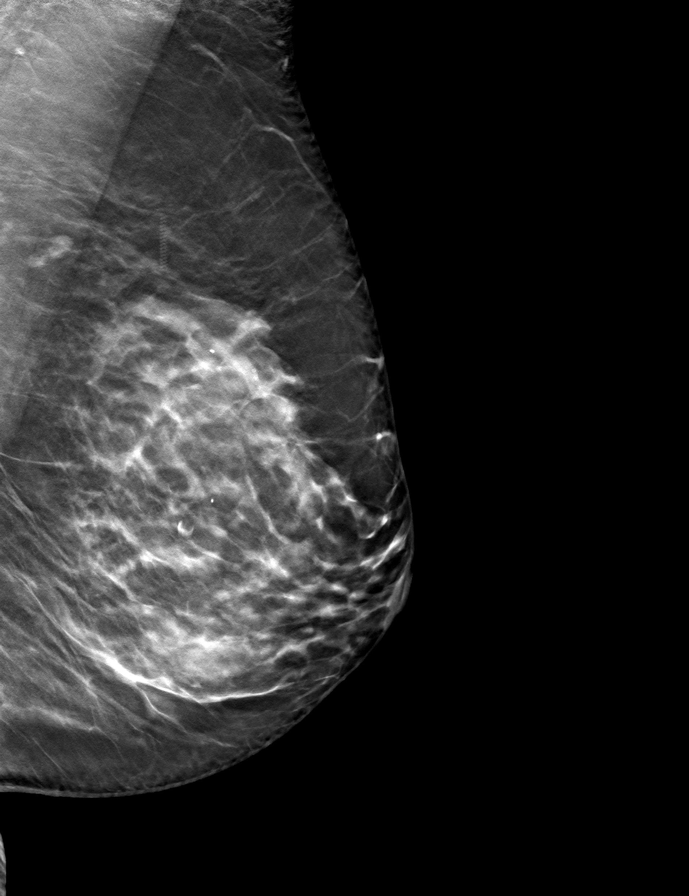
[frame 39/76]
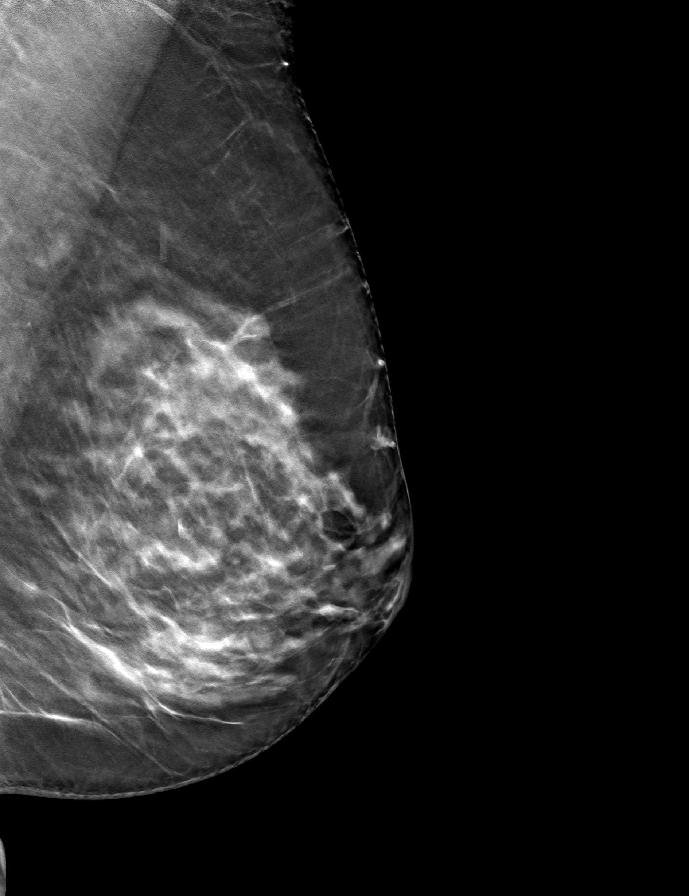

[R CC tomo · tomo slice 36/71.0]
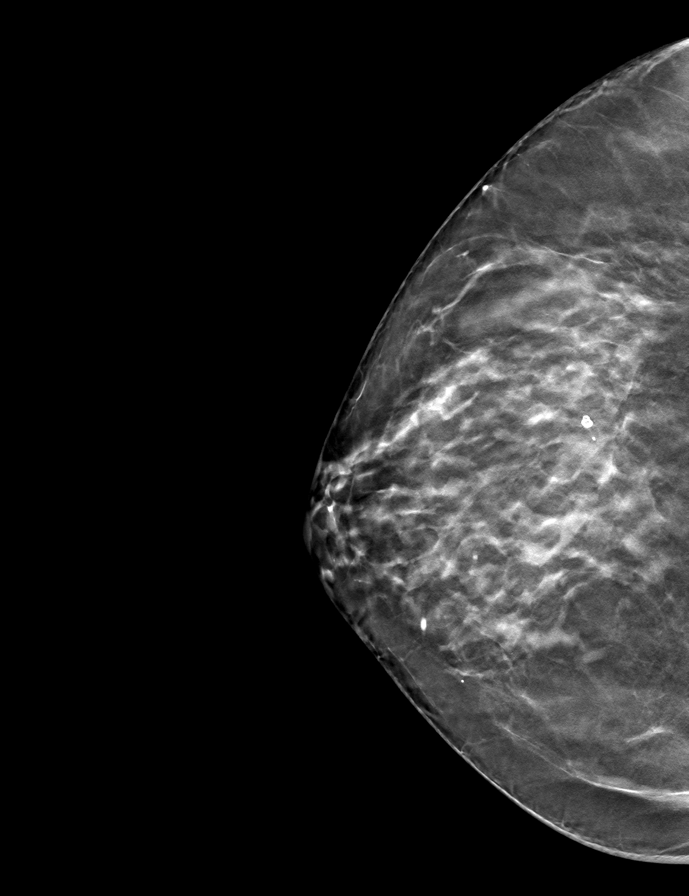

[L CC tomo · tomo slice 37/72.0]
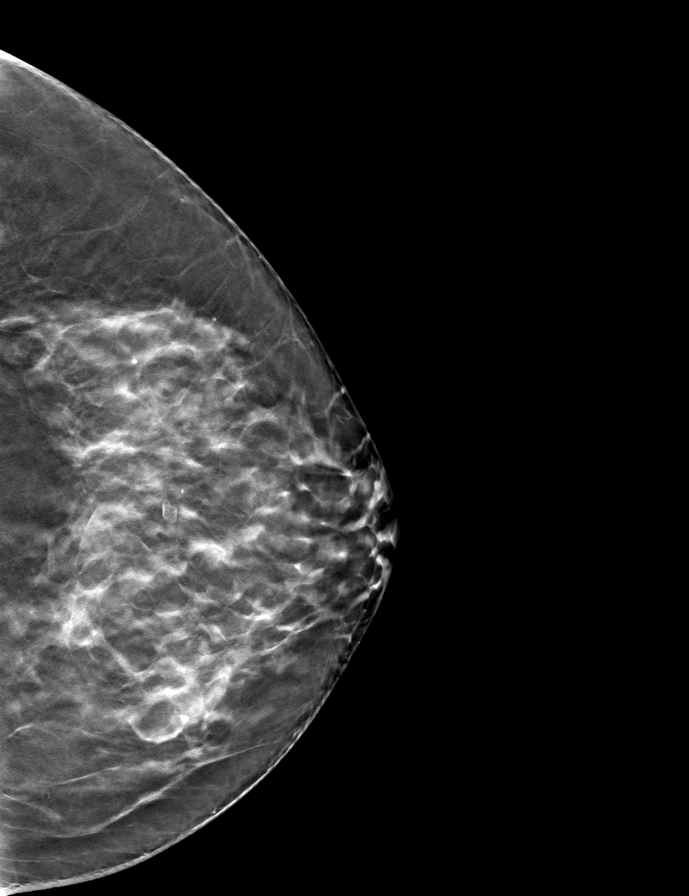

[R MLO tomo · tomo slice 37/73.0]
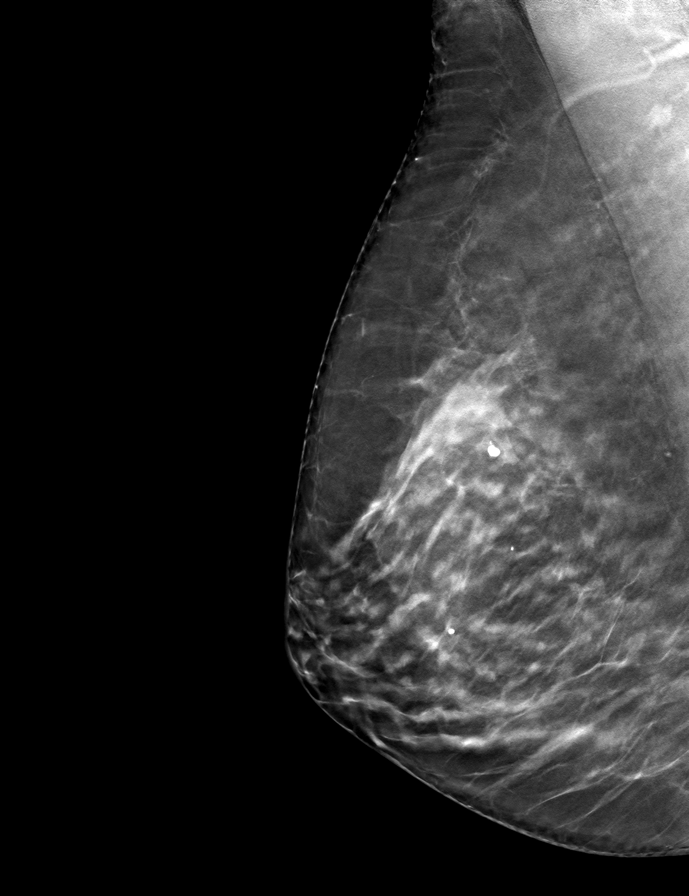

[9 of 24 positions shown; findings below may reference images not displayed]

ACR Breast Density Category c: The breast tissue is heterogeneously
dense, which may obscure small masses.
FINDINGS: There are no findings suspicious for malignancy. Images were
processed with CAD.
IMPRESSION: No mammographic evidence of malignancy. A result letter of this
screening mammogram will be mailed directly to the patient.

RECOMMENDATION:
Screening mammogram in one year. (Code:[id number])

BI-RADS CATEGORY  1: Negative.
